# Patient Record
Sex: Male | Born: 1970 | Race: Black or African American | Hispanic: No | Marital: Married | Smoking: Current every day smoker
Health system: Southern US, Community
[De-identification: ages and names within clinical notes are randomized; demographics above are authoritative.]

## PROBLEM LIST (undated history)

## (undated) DIAGNOSIS — I10 Essential (primary) hypertension: Secondary | ICD-10-CM

## (undated) HISTORY — PX: KNEE CARTILAGE SURGERY: SHX688

---

## 2009-10-01 ENCOUNTER — Emergency Department (HOSPITAL_COMMUNITY): Admission: EM | Admit: 2009-10-01 | Discharge: 2009-10-01 | Payer: Self-pay | Admitting: Emergency Medicine

## 2010-04-29 ENCOUNTER — Encounter: Payer: Self-pay | Admitting: Emergency Medicine

## 2018-09-18 ENCOUNTER — Encounter (HOSPITAL_COMMUNITY): Payer: Self-pay | Admitting: Emergency Medicine

## 2018-09-18 ENCOUNTER — Other Ambulatory Visit: Payer: Self-pay

## 2018-09-18 ENCOUNTER — Ambulatory Visit (HOSPITAL_COMMUNITY)
Admission: EM | Admit: 2018-09-18 | Discharge: 2018-09-18 | Disposition: A | Discharge status: Home / Self Care | Payer: 59 | Attending: Internal Medicine | Admitting: Internal Medicine

## 2018-09-18 DIAGNOSIS — I10 Essential (primary) hypertension: Secondary | ICD-10-CM | POA: Diagnosis not present

## 2018-09-18 DIAGNOSIS — Z9114 Patient's other noncompliance with medication regimen: Secondary | ICD-10-CM | POA: Diagnosis not present

## 2018-09-18 DIAGNOSIS — M25552 Pain in left hip: Secondary | ICD-10-CM | POA: Diagnosis not present

## 2018-09-18 HISTORY — DX: Essential (primary) hypertension: I10

## 2018-09-18 MED ORDER — NAPROXEN 375 MG PO TABS: 375.0000 mg | ORAL_TABLET | Freq: Two times a day (BID) | ORAL | 0 refills | Status: DC

## 2018-09-18 MED ORDER — HYDROCHLOROTHIAZIDE 25 MG PO TABS: 25.0000 mg | ORAL_TABLET | Freq: Every day | ORAL | 2 refills | Status: DC

## 2018-09-18 MED ORDER — KETOROLAC TROMETHAMINE 30 MG/ML IJ SOLN
INTRAMUSCULAR | Status: AC
  Filled 2018-09-18: qty 1

## 2018-09-18 MED ORDER — KETOROLAC TROMETHAMINE 30 MG/ML IJ SOLN
30.0000 mg | Freq: Once | INTRAMUSCULAR | Status: AC
  Administered 2018-09-18: 30 mg via INTRAMUSCULAR

## 2018-09-18 NOTE — ED Triage Notes (Signed)
left hip pain started Tuesday and has worsened since then.  Right hip hurt last week.  Patient helped family members move, no known injury.    Patient recently changed work duties as well

## 2018-09-18 NOTE — ED Provider Notes (Signed)
[facility]    CSN: [id number] Arrival date & time: 09/18/18  0857     History   Chief Complaint Chief Complaint  Patient presents with  . Hip Pain    HPI Clarence Pierce is a 48 y.o. male with history of hypertension comes to urgent care with complaints of left hip pain which started a few days ago.  Patient recently helped his daughters to move from one apartment to another.  Patient denies any trauma to the hip.  He has been doing some heavy lifting recently.  Pain is constant and of moderate severity.  Is worse with bearing weight.  Denies any relieving factors.  Patient has not tried any over-the-counter medications.  No fever or chills.  No nausea or vomiting.  No diarrhea.  HPI  Past Medical History:  Diagnosis Date  . Hypertension     There are no active problems to display for this patient.   Past Surgical History:  Procedure Laterality Date  . KNEE CARTILAGE SURGERY         Home Medications    Prior to Admission medications   Medication Sig Start Date End Date Taking? Authorizing Provider  hydrochlorothiazide (HYDRODIURIL) 25 MG tablet Take 1 tablet (25 mg total) by mouth daily. 09/18/18   Merrilee JanskyLamptey, Kacey Dysert O, MD  naproxen (NAPROSYN) 375 MG tablet Take 1 tablet (375 mg total) by mouth 2 (two) times daily. 09/18/18   Clemente Dewey, Britta MccreedyPhilip O, MD    Family History Family History  Problem Relation Age of Onset  . Cancer Mother   . Hypertension Father     Social History Social History   Tobacco Use  . Smoking status: Current Every Day Smoker  Substance Use Topics  . Alcohol use: Yes  . Drug use: Never     Allergies   Patient has no known allergies.   Review of Systems Review of Systems  Constitutional: Negative for activity change, appetite change and fatigue.  HENT: Negative.   Respiratory: Negative.   Cardiovascular: Negative.   Gastrointestinal: Negative.   Genitourinary: Negative for dysuria, frequency, scrotal swelling, testicular  pain and urgency.  Musculoskeletal: Positive for arthralgias and gait problem. Negative for back pain, joint swelling, neck pain and neck stiffness.  Skin: Negative for rash and wound.  Neurological: Negative for dizziness, weakness and light-headedness.     Physical Exam Triage Vital Signs ED Triage Vitals  Enc Vitals Group     BP 09/18/18 0932 (!) 168/105     Pulse Rate 09/18/18 0932 (!) 113     Resp 09/18/18 0932 20     Temp 09/18/18 0932 98.2 F (36.8 C)     Temp src --      SpO2 09/18/18 0932 96 %     Weight --      Height --      Head Circumference --      Peak Flow --      Pain Score 09/18/18 0927 6     Pain Loc --      Pain Edu? --      Excl. in GC? --    No data found.  Updated Vital Signs BP (!) 168/105 (BP Location: Left Arm)   Pulse (!) 113   Temp 98.2 F (36.8 C)   Resp 20   SpO2 96%   Visual Acuity Right Eye Distance:   Left Eye Distance:   Bilateral Distance:    Right Eye Near:   Left Eye Near:  Bilateral Near:     Physical Exam Constitutional:      Appearance: Normal appearance. He is not ill-appearing or toxic-appearing.  Cardiovascular:     Rate and Rhythm: Normal rate and regular rhythm.     Pulses: Normal pulses.     Heart sounds: Normal heart sounds.  Pulmonary:     Effort: Pulmonary effort is normal. No respiratory distress.     Breath sounds: Normal breath sounds. No wheezing or rales.  Abdominal:     General: Abdomen is flat. Bowel sounds are normal. There is no distension.     Tenderness: There is no abdominal tenderness. There is no guarding.  Musculoskeletal:        General: No swelling, tenderness, deformity or signs of injury.  Skin:    General: Skin is warm.     Capillary Refill: Capillary refill takes less than 2 seconds.     Coloration: Skin is not jaundiced.     Findings: No bruising or lesion.  Neurological:     Mental Status: He is alert.      UC Treatments / Results  Labs (all labs ordered are listed, but  only abnormal results are displayed) Labs Reviewed - No data to display  EKG None  Radiology No results found.  Procedures Procedures (including critical care time)  Medications Ordered in UC Medications  ketorolac (TORADOL) 30 MG/ML injection 30 mg (30 mg Intramuscular Given 09/18/18 1003)  ketorolac (TORADOL) 30 MG/ML injection (has no administration in time range)    Initial Impression / Assessment and Plan / UC Course  I have reviewed the triage vital signs and the nursing notes.  Pertinent labs & imaging results that were available during my care of the patient were reviewed by me and considered in my medical decision making (see chart for details).     1.  Left hip pain likely osteoarthritis related: Naproxen as needed for pain Gentle range of motion If pain does not improve, patient may need radiologic evaluation.  2.  Uncontrolled hypertension secondary to medication noncompliance: Patient is started on hydrochlorothiazide 25 mg orally daily Patient is scheduled to follow-up with his primary care physicians in the [place] system   Final Clinical Impressions(s) / UC Diagnoses   Final diagnoses:  Left hip pain   Discharge Instructions   None    ED Prescriptions    Medication Sig Dispense Auth. Provider   naproxen (NAPROSYN) 375 MG tablet Take 1 tablet (375 mg total) by mouth 2 (two) times daily. 30 tablet Mackinley Cassaday, Myrene Galas, MD   hydrochlorothiazide (HYDRODIURIL) 25 MG tablet Take 1 tablet (25 mg total) by mouth daily. 30 tablet Yailine Ballard, Myrene Galas, MD     Controlled Substance Prescriptions [place] Controlled Substance Registry consulted? No   Chase Picket, MD 09/18/18 [phone]

## 2018-12-18 ENCOUNTER — Other Ambulatory Visit: Payer: Self-pay

## 2018-12-18 DIAGNOSIS — Z20822 Contact with and (suspected) exposure to covid-19: Secondary | ICD-10-CM

## 2018-12-19 LAB — NOVEL CORONAVIRUS, NAA: SARS-CoV-2, NAA: NOT DETECTED

## 2019-04-06 ENCOUNTER — Emergency Department (HOSPITAL_COMMUNITY): Payer: 59

## 2019-04-06 ENCOUNTER — Other Ambulatory Visit: Payer: Self-pay

## 2019-04-06 ENCOUNTER — Emergency Department (HOSPITAL_COMMUNITY)
Admission: EM | Admit: 2019-04-06 | Discharge: 2019-04-06 | Disposition: A | Discharge status: Home / Self Care | Payer: 59 | Attending: Emergency Medicine | Admitting: Emergency Medicine

## 2019-04-06 ENCOUNTER — Encounter (HOSPITAL_COMMUNITY): Payer: Self-pay | Admitting: Emergency Medicine

## 2019-04-06 DIAGNOSIS — R7989 Other specified abnormal findings of blood chemistry: Secondary | ICD-10-CM | POA: Diagnosis not present

## 2019-04-06 DIAGNOSIS — Z791 Long term (current) use of non-steroidal anti-inflammatories (NSAID): Secondary | ICD-10-CM | POA: Diagnosis not present

## 2019-04-06 DIAGNOSIS — I4891 Unspecified atrial fibrillation: Secondary | ICD-10-CM | POA: Diagnosis not present

## 2019-04-06 DIAGNOSIS — Z79899 Other long term (current) drug therapy: Secondary | ICD-10-CM | POA: Diagnosis not present

## 2019-04-06 DIAGNOSIS — R0789 Other chest pain: Secondary | ICD-10-CM | POA: Diagnosis present

## 2019-04-06 DIAGNOSIS — I10 Essential (primary) hypertension: Secondary | ICD-10-CM | POA: Insufficient documentation

## 2019-04-06 DIAGNOSIS — R079 Chest pain, unspecified: Secondary | ICD-10-CM

## 2019-04-06 LAB — CBC
HCT: 42.5 % (ref 39.0–52.0)
Hemoglobin: 14.3 g/dL (ref 13.0–17.0)
MCH: 30.9 pg (ref 26.0–34.0)
MCHC: 33.6 g/dL (ref 30.0–36.0)
MCV: 91.8 fL (ref 80.0–100.0)
Platelets: 232 10*3/uL (ref 150–400)
RBC: 4.63 MIL/uL (ref 4.22–5.81)
RDW: 14.3 % (ref 11.5–15.5)
WBC: 9.6 10*3/uL (ref 4.0–10.5)
nRBC: 0 % (ref 0.0–0.2)

## 2019-04-06 LAB — BASIC METABOLIC PANEL
Anion gap: 15 (ref 5–15)
BUN: 15 mg/dL (ref 6–20)
CO2: 22 mmol/L (ref 22–32)
Calcium: 9.8 mg/dL (ref 8.9–10.3)
Chloride: 104 mmol/L (ref 98–111)
Creatinine, Ser: 1.27 mg/dL — ABNORMAL HIGH (ref 0.61–1.24)
GFR calc Af Amer: >60 mL/min (ref >60)
GFR calc non Af Amer: >60 mL/min (ref >60)
Glucose, Bld: 180 mg/dL — ABNORMAL HIGH (ref 70–99)
Potassium: 3.6 mmol/L (ref 3.5–5.1)
Sodium: 141 mmol/L (ref 135–145)

## 2019-04-06 LAB — T4, FREE: Free T4: 0.79 ng/dL (ref 0.61–1.12)

## 2019-04-06 LAB — MAGNESIUM: Magnesium: 1.8 mg/dL (ref 1.7–2.4)

## 2019-04-06 LAB — TROPONIN I (HIGH SENSITIVITY): Troponin I (High Sensitivity): 16 ng/L (ref <18)

## 2019-04-06 LAB — TSH: TSH: 5.465 u[IU]/mL — ABNORMAL HIGH (ref 0.350–4.500)

## 2019-04-06 MED ORDER — SODIUM CHLORIDE 0.9% FLUSH
3.0000 mL | Freq: Once | INTRAVENOUS | Status: AC
  Administered 2019-04-06: 3 mL via INTRAVENOUS

## 2019-04-06 MED ORDER — PROPOFOL 10 MG/ML IV BOLUS
INTRAVENOUS | Status: AC | PRN
  Administered 2019-04-06: 25 mg via INTRAVENOUS

## 2019-04-06 MED ORDER — DILTIAZEM HCL-DEXTROSE 125-5 MG/125ML-% IV SOLN (PREMIX)
5.0000 mg/h | INTRAVENOUS | Status: DC
  Administered 2019-04-06: 5 mg/h via INTRAVENOUS

## 2019-04-06 MED ORDER — PROPOFOL 10 MG/ML IV BOLUS
0.5000 mg/kg | Freq: Once | INTRAVENOUS | Status: AC
  Administered 2019-04-06: 47.9 mg via INTRAVENOUS
  Filled 2019-04-06: qty 20

## 2019-04-06 MED ORDER — DILTIAZEM HCL-DEXTROSE 125-5 MG/125ML-% IV SOLN (PREMIX)
INTRAVENOUS | Status: AC
  Filled 2019-04-06: qty 125

## 2019-04-06 MED ORDER — DILTIAZEM LOAD VIA INFUSION
15.0000 mg | Freq: Once | INTRAVENOUS | Status: AC
  Administered 2019-04-06: 15 mg via INTRAVENOUS
  Filled 2019-04-06: qty 15

## 2019-04-06 MED ORDER — APIXABAN 5 MG PO TABS
5.0000 mg | ORAL_TABLET | ORAL | Status: AC
  Administered 2019-04-06: 5 mg via ORAL
  Filled 2019-04-06: qty 1

## 2019-04-06 MED ORDER — APIXABAN 5 MG PO TABS: 5.0000 mg | ORAL_TABLET | Freq: Two times a day (BID) | ORAL | 0 refills | Status: DC

## 2019-04-06 NOTE — ED Triage Notes (Addendum)
Pt complaint of central chest pain onset 0600 this morning; "on and off;" verbalizing associated "sweating;" denies other symptoms or hx of same.  Pt verbalizes took 2 aspirin at 0600.  Hx of hypertension; has not taken medication in 3 months per pt.

## 2019-04-06 NOTE — Discharge Instructions (Addendum)
You were seen in the emergency department for chest pain and were noted to have a rapid heart rate.  Your heart rhythm was atrial fibrillation.  You were successfully cardioverted back into a normal rhythm.  Will be important that you take your blood thinner daily.  Cardiology clinic should call you for follow-up.  We did blood work and it showed that your thyroid test was slightly abnormal.  This will also need to be followed up with your doctor.  Please return to the emergency department if any worsening symptoms.

## 2019-04-06 NOTE — ED Notes (Signed)
Wasted propofol with Rolla Plate, RN in the biohazard sink.

## 2019-04-06 NOTE — Sedation Documentation (Signed)
Cardioversion administered at 125 J. Synced and shocked. Recorded.

## 2019-04-06 NOTE — ED Provider Notes (Signed)
[facility]EMERGENCY DEPT Provider Note   CSN: [id number] Arrival date & time: 04/06/19  1941     History Chief Complaint  Patient presents with  . Chest Pain    Clarence Pierce is a 48 y.o. male.  He has a history of hypertension.  He said he woke up around 6 this morning with chest pain and sweating.  His symptoms are on and off for about an hour then became more consistent.  He feels his heart beating hard in his chest.  No recent illness.  No shortness of breath.  No prior history of arrhythmias.  Denies any cocaine.  States he drinks alcohol but has not been drinking more last than usual.  The history is provided by the patient.  Chest Pain Pain location:  Substernal area Pain quality: aching   Pain radiates to:  Does not radiate Pain severity:  Moderate Onset quality:  Sudden Duration:  2 hours Timing:  Intermittent Progression:  Unchanged Chronicity:  New Context: at rest   Relieved by:  Nothing Worsened by:  Nothing Ineffective treatments:  None tried Associated symptoms: diaphoresis and palpitations   Associated symptoms: no abdominal pain, no back pain, no cough, no fever, no headache, no nausea, no shortness of breath, no vomiting and no weakness   Risk factors: hypertension, male sex and smoking        Past Medical History:  Diagnosis Date  . Hypertension     There are no problems to display for this patient.   Past Surgical History:  Procedure Laterality Date  . KNEE CARTILAGE SURGERY         Family History  Problem Relation Age of Onset  . Cancer Mother   . Hypertension Father     Social History   Tobacco Use  . Smoking status: Current Every Day Smoker  Substance Use Topics  . Alcohol use: Yes  . Drug use: Never    Home Medications Prior to Admission medications   Medication Sig Start Date End Date Taking? Authorizing Provider  hydrochlorothiazide (HYDRODIURIL) 25 MG tablet Take 1 tablet (25 mg total) by mouth  daily. 09/18/18   Merrilee Jansky, MD  naproxen (NAPROSYN) 375 MG tablet Take 1 tablet (375 mg total) by mouth 2 (two) times daily. 09/18/18   Lamptey, Britta Mccreedy, MD    Allergies    Patient has no known allergies.  Review of Systems   Review of Systems  Constitutional: Positive for diaphoresis. Negative for fever.  HENT: Negative for sore throat.   Eyes: Negative for visual disturbance.  Respiratory: Negative for cough and shortness of breath.   Cardiovascular: Positive for chest pain and palpitations.  Gastrointestinal: Negative for abdominal pain, nausea and vomiting.  Genitourinary: Negative for dysuria.  Musculoskeletal: Negative for back pain.  Skin: Negative for rash.  Neurological: Negative for weakness and headaches.    Physical Exam Updated Vital Signs BP (!) 127/95 (BP Location: Left Arm)   Pulse (!) 153   Temp 97.6 F (36.4 C) (Oral)   Resp 16   Ht 5\' 9"  (1.753 m)   Wt 95.7 kg   SpO2 96%   BMI 31.16 kg/m   Physical Exam Vitals and nursing note reviewed.  Constitutional:      Appearance: He is well-developed.  HENT:     Head: Normocephalic and atraumatic.  Eyes:     Conjunctiva/sclera: Conjunctivae normal.  Cardiovascular:     Rate and Rhythm: Tachycardia present. Rhythm irregular.  Pulses:          Radial pulses are 2+ on the right side and 2+ on the left side.     Heart sounds: No murmur.  Pulmonary:     Effort: Pulmonary effort is normal. No respiratory distress.     Breath sounds: Normal breath sounds.  Abdominal:     Palpations: Abdomen is soft.     Tenderness: There is no abdominal tenderness.  Musculoskeletal:        General: Normal range of motion.     Cervical back: Neck supple.     Right lower leg: No tenderness. No edema.     Left lower leg: No tenderness. No edema.  Skin:    General: Skin is warm and dry.     Capillary Refill: Capillary refill takes less than 2 seconds.  Neurological:     General: No focal deficit present.      Mental Status: He is alert.     ED Results / Procedures / Treatments   Labs (all labs ordered are listed, but only abnormal results are displayed) Labs Reviewed  BASIC METABOLIC PANEL - Abnormal; Notable for the following components:      Result Value   Glucose, Bld 180 (*)    Creatinine, Ser 1.27 (*)    All other components within normal limits  TSH - Abnormal; Notable for the following components:   TSH 5.465 (*)    All other components within normal limits  CBC  MAGNESIUM  T4, FREE  T3  TROPONIN I (HIGH SENSITIVITY)    EKG EKG Interpretation  Date/Time:  Monday April 06 2019 07:50:33 EST Ventricular Rate:  174 PR Interval:    QRS Duration: 77 QT Interval:  297 QTC Calculation: 506 R Axis:   -17 Text Interpretation: Atrial fibrillation with rapid V-rate Ventricular premature complex Borderline left axis deviation Abnormal R-wave progression, early transition No old tracing to compare Confirmed by Meridee Score [phone]) on 04/06/2019 8:00:34 AM   Radiology DG Chest Port 1 View  Result Date: 04/06/2019 CLINICAL DATA:  48 year old male with a history chest pain EXAM: PORTABLE CHEST 1 VIEW COMPARISON:  None. FINDINGS: The heart size and mediastinal contours are within normal limits. Both lungs are clear. The visualized skeletal structures are unremarkable. IMPRESSION: Negative for acute cardiopulmonary disease Electronically Signed   By: Gilmer Mor D.O.   On: 04/06/2019 08:25    Procedures .Critical Care Performed by: Terrilee Files, MD Authorized by: Terrilee Files, MD   Critical care provider statement:    Critical care time (minutes):  45   Critical care time was exclusive of:  Separately billable procedures and treating other patients   Critical care was necessary to treat or prevent imminent or life-threatening deterioration of the following conditions:  Circulatory failure and cardiac failure   Critical care was time spent personally by me on the  following activities:  Discussions with consultants, evaluation of patient's response to treatment, examination of patient, ordering and performing treatments and interventions, ordering and review of laboratory studies, ordering and review of radiographic studies, pulse oximetry, re-evaluation of patient's condition, obtaining history from patient or surrogate, review of old charts and development of treatment plan with patient or surrogate   I assumed direction of critical care for this patient from another provider in my specialty: no   .Sedation  Date/Time: 04/06/2019 9:46 AM Performed by: Terrilee Files, MD Authorized by: Terrilee Files, MD   Consent:    Consent obtained:  Verbal and written   Consent given by:  Patient   Risks discussed:  Allergic reaction, dysrhythmia, inadequate sedation, nausea, prolonged hypoxia resulting in organ damage, prolonged sedation necessitating reversal, respiratory compromise necessitating ventilatory assistance and intubation and vomiting   Alternatives discussed:  Analgesia without sedation, anxiolysis and regional anesthesia Universal protocol:    Procedure explained and questions answered to patient or proxy's satisfaction: yes     Relevant documents present and verified: yes     Test results available and properly labeled: yes     Imaging studies available: yes     Required blood products, implants, devices, and special equipment available: yes     Site/side marked: yes     Immediately prior to procedure a time out was called: yes     Patient identity confirmation method:  Verbally with patient and arm band Indications:    Procedure necessitating sedation performed by:  Physician performing sedation Pre-sedation assessment:    Time since last food or drink:  12   ASA classification: class 2 - patient with mild systemic disease     Neck mobility: normal     Mouth opening:  3 or more finger widths   Thyromental distance:  4 finger widths    Mallampati score:  I - soft palate, uvula, fauces, pillars visible   Pre-sedation assessments completed and reviewed: airway patency, cardiovascular function, hydration status, mental status, nausea/vomiting, pain level, respiratory function and temperature     Pre-sedation assessment completed:  04/06/2019 9:00 AM Immediate pre-procedure details:    Reassessment: Patient reassessed immediately prior to procedure     Reviewed: vital signs, relevant labs/tests and NPO status     Verified: bag valve mask available, emergency equipment available, intubation equipment available, IV patency confirmed, oxygen available and suction available   Procedure details (see MAR for exact dosages):    Preoxygenation:  Nasal cannula   Sedation:  Propofol   Intended level of sedation: deep   Intra-procedure monitoring:  Blood pressure monitoring, cardiac monitor, continuous pulse oximetry, frequent LOC assessments, frequent vital sign checks and continuous capnometry   Intra-procedure events: none     Total Provider sedation time (minutes):  15 Post-procedure details:    Post-sedation assessment completed:  04/06/2019 10:01 AM   Attendance: Constant attendance by certified staff until patient recovered     Recovery: Patient returned to pre-procedure baseline     Post-sedation assessments completed and reviewed: airway patency, cardiovascular function, hydration status, mental status, nausea/vomiting, pain level, respiratory function and temperature     Patient is stable for discharge or admission: yes     Patient tolerance:  Tolerated well, no immediate complications .Cardioversion  Date/Time: 04/06/2019 9:47 AM Performed by: Hayden Rasmussen, MD Authorized by: Hayden Rasmussen, MD   Consent:    Consent obtained:  Written   Consent given by:  Patient   Risks discussed:  Cutaneous burn, death, induced arrhythmia and pain   Alternatives discussed:  Rate-control medication, delayed treatment and  observation Pre-procedure details:    Cardioversion basis:  Elective   Rhythm:  Atrial fibrillation   Electrode placement:  Anterior-posterior Patient sedated: Yes. Refer to sedation procedure documentation for details of sedation.  Attempt one:    Cardioversion mode:  Synchronous   Shock (Joules):  120   Shock outcome:  Conversion to normal sinus rhythm Post-procedure details:    Patient status:  Awake   Patient tolerance of procedure:  Tolerated well, no immediate complications   (including critical care time)  Medications Ordered in ED Medications  diltiazem (CARDIZEM) 1 mg/mL load via infusion 15 mg (15 mg Intravenous Bolus from Bag 04/06/19 0804)    And  diltiazem (CARDIZEM) 125 mg in dextrose 5% 125 mL (1 mg/mL) infusion (12.5 mg/hr Intravenous Rate/Dose Change 04/06/19 0900)  dilTIAZem HCl-Dextrose 125-5 MG/125ML-% infusion (  Not Given 04/06/19 0815)  sodium chloride flush (NS) 0.9 % injection 3 mL (3 mLs Intravenous Given 04/06/19 0802)  propofol (DIPRIVAN) 10 mg/mL bolus/IV push 47.9 mg (47.9 mg Intravenous Given 04/06/19 0938)  propofol (DIPRIVAN) 10 mg/mL bolus/IV push (25 mg Intravenous Given 04/06/19 0940)    ED Course  I have reviewed the triage vital signs and the nursing notes.  Pertinent labs & imaging results that were available during my care of the patient were reviewed by me and considered in my medical decision making (see chart for details).  Clinical Course as of Apr 06 1051  Mon Apr 06, 2019  [id number] 48 year old male history of hypertension here with chest pain and diaphoresis acute onset 6 AM.  He is in a narrow complex irregular fast rhythm likely A. fib.  He had taken aspirin prior to arrival.  His blood pressure is okay so we will proceed with rate control for now.   [MB]  [id number] Chest x-ray interpreted by me as no pneumothorax no gross infiltrates.   [MB]  [id number] Patient received 15 mg of IV diltiazem followed by a titratable drip.  Heart rates improving  down to 120s with some brief bursts up to 140.  His symptoms are improved.   [MB]  0920 Patient remains on a diltiazem drip with elevated heart rates of 1 20-1 40.  Not symptomatic.  Reviewed with cardiology Dr Elease HashimotoNahser.  He felt that if we are confident of the timeframe of onset of A. fib then can proceed with cardioversion.  Reviewed with the patient the risks and benefits of both procedural sedation and cardioversion.  He is agreeable to procedure.  He had a sip of water this morning but is otherwise n.p.o.   [MB]  [id number] Patient successfully cardioverted with 120 J synchronized.  Repeat EKG shows sinus tachycardia.   [MB]  1021 Pharmacy has counseled the patient regarding anticoagulation.   [MB]    Clinical Course User Index [MB] Terrilee FilesButler, Sira Adsit C, MD   MDM Rules/Calculators/A&P                     This patients CHA2DS2-VASc Score and unadjusted Ischemic Stroke Rate (% per year) is equal to 0.6 % stroke rate/year from a score of 1  Above score calculated as 1 point each if present [CHF, HTN, DM, Vascular=MI/PAD/Aortic Plaque, Age if 65-74, or Male] Above score calculated as 2 points each if present [Age > 75, or Stroke/TIA/TE]  CHA2DS2/VAS Stroke Risk Points  Current as of 2 minutes ago     1 >= 2 Points: High Risk  1 - 1.99 Points: Medium Risk  0 Points: Low Risk    The patient's score has not changed in the past year.: No Change     Details    This score determines the patient's risk of having a stroke if the  patient has atrial fibrillation.       Points Metrics  0 Has Congestive Heart Failure:  No    Current as of 2 minutes ago  0 Has Vascular Disease:  No    Current as of 2 minutes ago  1 Has Hypertension:  Yes  Current as of 2 minutes ago  0 Age:  12    Current as of 2 minutes ago  0 Has Diabetes:  No    Current as of 2 minutes ago  0 Had Stroke:  No  Had TIA:  No  Had thromboembolism:  No    Current as of 2 minutes ago  0 Male:  No    Current as of 2 minutes  ago                  Final Clinical Impression(s) / ED Diagnoses Final diagnoses:  Atrial fibrillation with RVR (HCC)  Nonspecific chest pain  High serum thyroid stimulating hormone (TSH)  Essential hypertension    Rx / DC Orders ED Discharge Orders         Ordered    Amb referral to [facility]     04/06/19 0800    apixaban (ELIQUIS) 5 MG TABS tablet  2 times daily     04/06/19 1019           Terrilee Files, MD 04/06/19 1703

## 2019-04-06 NOTE — Sedation Documentation (Signed)
EKG given to Melina Copa, MD.

## 2019-04-06 NOTE — Progress Notes (Signed)
[place] for apixaban Indication: atrial fibrillation, now s/p cardioversion  No Known Allergies  Patient Measurements: Height: 5\' 9"  (175.3 cm) Weight: 211 lb (95.7 kg) IBW/kg (Calculated) : 70.7   Vital Signs: Temp: 97.6 F (36.4 C) (12/28 0752) Temp Source: Oral (12/28 0752) BP: 161/116 (12/28 1015) Pulse Rate: 101 (12/28 1010)  Labs: Recent Labs    04/06/19 0751  HGB 14.3  HCT 42.5  PLT 232  CREATININE 1.27*  TROPONINIHS 16    Estimated Creatinine Clearance: 79.4 mL/min (A) (by C-G formula based on SCr of 1.27 mg/dL (H)).   PTA Medications:  HCTZ 25 mg daily Naproxen 375 mg BID  Assessment: 48 y/o M with h/o HTN, had onset of chest pain and diaphoresis 6 am today and was found to be in rapid narrow complex irregular rhythm, likely atrial fibrillation.  Underwent successful cardioversion this AM.  Orders received for pharmacy assistance with direct-acting oral anticoagulant (DOAC) dosing.  Age, Wt, and SCr are all below weight-adjustment thresholds and support proceeding with full-dose anticoagulation with apixaban    Plan:  Apixaban 5 mg PO now, then BID Provided medication teaching; questions regarding apixaban were answered Provided a voucher card for patient to have a 30-day prescription filled at no cost to him Patient will need a prescription for apixaban 5 mg PO BID.  Thank you for allowing pharmacy to participate in this patient's care.  Clayburn Pert, PharmD, BCPS 04/06/2019  10:32 AM

## 2019-04-07 LAB — T3: T3, Total: 120 ng/dL (ref 71–180)

## 2019-04-17 ENCOUNTER — Telehealth (HOSPITAL_COMMUNITY): Payer: Self-pay | Admitting: Physician Assistant

## 2019-04-17 NOTE — Telephone Encounter (Signed)
Referral received from [facility] for pt to be seen in [facility].  Called and left message for pt to call back to schedule.  Caleb Popp, RMA, had previously left message on 04/09/2019 requesting pt to call us to schedule.

## 2019-04-24 ENCOUNTER — Encounter (HOSPITAL_COMMUNITY): Payer: Self-pay | Admitting: Physician Assistant

## 2019-04-27 NOTE — Telephone Encounter (Signed)
Letter mailed to pt to call [facility] to scheduled ED f/u appt.

## 2019-05-07 ENCOUNTER — Encounter (HOSPITAL_COMMUNITY): Payer: Self-pay | Admitting: Physician Assistant

## 2019-05-07 ENCOUNTER — Ambulatory Visit (HOSPITAL_COMMUNITY)
Admission: RE | Admit: 2019-05-07 | Discharge: 2019-05-07 | Disposition: A | Discharge status: Home / Self Care | Payer: 59 | Source: Ambulatory Visit | Attending: Physician Assistant | Admitting: Physician Assistant

## 2019-05-07 ENCOUNTER — Other Ambulatory Visit: Payer: Self-pay

## 2019-05-07 VITALS — BP 146/94 | HR 98 | Ht 69.0 in | Wt 209.8 lb

## 2019-05-07 DIAGNOSIS — I10 Essential (primary) hypertension: Secondary | ICD-10-CM | POA: Insufficient documentation

## 2019-05-07 DIAGNOSIS — I480 Paroxysmal atrial fibrillation: Secondary | ICD-10-CM | POA: Diagnosis not present

## 2019-05-07 DIAGNOSIS — F17200 Nicotine dependence, unspecified, uncomplicated: Secondary | ICD-10-CM | POA: Insufficient documentation

## 2019-05-07 DIAGNOSIS — E669 Obesity, unspecified: Secondary | ICD-10-CM | POA: Diagnosis not present

## 2019-05-07 DIAGNOSIS — I4891 Unspecified atrial fibrillation: Secondary | ICD-10-CM | POA: Diagnosis present

## 2019-05-07 DIAGNOSIS — Z8249 Family history of ischemic heart disease and other diseases of the circulatory system: Secondary | ICD-10-CM | POA: Diagnosis not present

## 2019-05-07 DIAGNOSIS — Z79899 Other long term (current) drug therapy: Secondary | ICD-10-CM | POA: Insufficient documentation

## 2019-05-07 DIAGNOSIS — Z7901 Long term (current) use of anticoagulants: Secondary | ICD-10-CM | POA: Insufficient documentation

## 2019-05-07 MED ORDER — DILTIAZEM HCL ER COATED BEADS 180 MG PO CP24: 180.0000 mg | ORAL_CAPSULE | Freq: Every day | ORAL | 6 refills | Status: DC

## 2019-05-07 MED ORDER — APIXABAN 5 MG PO TABS: 5.0000 mg | ORAL_TABLET | Freq: Two times a day (BID) | ORAL | 3 refills | Status: AC

## 2019-05-07 NOTE — Patient Instructions (Signed)
Stop amlodipine  Start Cardizem 180mg  once a day

## 2019-05-07 NOTE — Progress Notes (Signed)
Primary Care Physician: Patient, No Pcp Per Primary Cardiologist: none Primary Electrophysiologist: none Referring Physician: [facility]   Clarence Pierce is a 49 y.o. male with a history of HTN and new onset atrial fibrillation who presents for consultation in the [facility].  The patient was initially diagnosed with atrial fibrillation on 04/06/19 after presenting to the ER with symptoms of chest pain, palpitations, and sweating. He was started in a diltiazem drip for rate control and underwent DCCV. Patient was started on Eliquis for a CHADS2VASC score of 1. There were no specific triggers that the patient could identify. He does admit to alcohol use, about 3 drinks daily. He also admits to severe snoring and witnessed apneic episodes. Since the DCCV he has not had any further symptoms. He is tolerating the Eliquis without difficulty.   Today, he denies symptoms of palpitations, chest pain, shortness of breath, orthopnea, PND, lower extremity edema, dizziness, presyncope, syncope, bleeding, or neurologic sequela. The patient is tolerating medications without difficulties and is otherwise without complaint today.    Atrial Fibrillation Risk Factors:  he does have symptoms of sleep apnea. he does not have a history of rheumatic fever. he does have a history of alcohol use. The patient does not have a history of early familial atrial fibrillation or other arrhythmias.  he has a BMI of There is no height or weight on file to calculate BMI.. There were no vitals filed for this visit.  Family History  Problem Relation Age of Onset  . Cancer Mother   . Hypertension Father      Atrial Fibrillation Management history:  Previous antiarrhythmic drugs: none Previous cardioversions: 04/06/19 Previous ablations: none   Past Medical History:  Diagnosis Date  . Hypertension    Past Surgical History:  Procedure Laterality Date  . KNEE CARTILAGE  SURGERY      Current Outpatient Medications  Medication Sig Dispense Refill  . apixaban (ELIQUIS) 5 MG TABS tablet Take 1 tablet (5 mg total) by mouth 2 (two) times daily. 60 tablet 0  . hydrochlorothiazide (HYDRODIURIL) 25 MG tablet Take 1 tablet (25 mg total) by mouth daily. (Patient not taking: Reported on 04/06/2019) 30 tablet 2  . naproxen (NAPROSYN) 375 MG tablet Take 1 tablet (375 mg total) by mouth 2 (two) times daily. (Patient not taking: Reported on 04/06/2019) 30 tablet 0   No current facility-administered medications for this visit.    No Known Allergies  Social History   Socioeconomic History  . Marital status: Married    Spouse name: Not on file  . Number of children: Not on file  . Years of education: Not on file  . Highest education level: Not on file  Occupational History  . Not on file  Tobacco Use  . Smoking status: Current Every Day Smoker  Substance and Sexual Activity  . Alcohol use: Yes  . Drug use: Never  . Sexual activity: Not on file  Other Topics Concern  . Not on file  Social History Narrative  . Not on file   Social Determinants of Health   Financial Resource Strain:   . Difficulty of Paying Living Expenses: Not on file  Food Insecurity:   . Worried About Programme researcher, broadcasting/film/video in the Last Year: Not on file  . Ran Out of Food in the Last Year: Not on file  Transportation Needs:   . Lack of Transportation (Medical): Not on file  . Lack of Transportation (  Non-Medical): Not on file  Physical Activity:   . Days of Exercise per Week: Not on file  . Minutes of Exercise per Session: Not on file  Stress:   . Feeling of Stress : Not on file  Social Connections:   . Frequency of Communication with Friends and Family: Not on file  . Frequency of Social Gatherings with Friends and Family: Not on file  . Attends Religious Services: Not on file  . Active Member of Clubs or Organizations: Not on file  . Attends Archivist Meetings: Not on  file  . Marital Status: Not on file  Intimate Partner Violence:   . Fear of Current or Ex-Partner: Not on file  . Emotionally Abused: Not on file  . Physically Abused: Not on file  . Sexually Abused: Not on file     ROS- All systems are reviewed and negative except as per the HPI above.  Physical Exam: There were no vitals filed for this visit.  GEN- The patient is well appearing obese male, alert and oriented x 3 today.   Head- normocephalic, atraumatic Eyes-  Sclera clear, conjunctiva pink Ears- hearing intact Oropharynx- clear Neck- supple  Lungs- Clear to ausculation bilaterally, normal work of breathing Heart- Regular rate and rhythm, no murmurs, rubs or gallops  GI- soft, NT, ND, + BS Extremities- no clubbing, cyanosis, or edema MS- no significant deformity or atrophy Skin- no rash or lesion Psych- euthymic mood, full affect Neuro- strength and sensation are intact  Wt Readings from Last 3 Encounters:  04/06/19 211 lb (95.7 kg)    EKG today demonstrates SR HR 98, PR 160, QRS 80, QTc 439  Epic records are reviewed at length today  CHA2DS2-VASc Score = 1 The patient's score is based upon: CHF History: No HTN History: Yes Age : < 65 Diabetes History: No Stroke History: No Vascular Disease History: No Gender: Male      ASSESSMENT AND PLAN: 1. Paroxysmal Atrial Fibrillation (ICD10:  I48.0) The patient's CHA2DS2-VASc score is 1, indicating a 0.6% annual risk of stroke.   General education about afib provided and questions answered. We also discussed his stroke risk and the risks and benefits of anticoagulation. Patient prefers to continue on anticoagulation for now. Continue Eliquis 5 mg BID. Will change amlodipine to diltiazem 180 mg daily for rate control. Check echocardiogram Lifestyle modification was discussed and encouraged including reducing alcohol intake.  2. Obesity There is no height or weight on file to calculate BMI. Lifestyle modification  was discussed at length including regular exercise and weight reduction.  3. Snoring/witnessed apnea The importance of adequate treatment of sleep apnea was discussed today in order to improve our ability to maintain sinus rhythm long term. Will refer patient for sleep study.  4. HTN Borderline today, med changes as above. May need to titrate diltiazem further at follow up.   Follow up in the AF clinic in one month.    [facility] [address] [place], [place] [address] [phone] 05/07/2019 10:04 AM

## 2019-05-11 ENCOUNTER — Telehealth: Payer: Self-pay | Admitting: *Deleted

## 2019-05-11 NOTE — Telephone Encounter (Signed)
Sleep PA submitted to [employer] via web portal. 

## 2019-05-13 ENCOUNTER — Other Ambulatory Visit: Payer: Self-pay

## 2019-05-13 ENCOUNTER — Ambulatory Visit (HOSPITAL_COMMUNITY)
Admission: RE | Admit: 2019-05-13 | Discharge: 2019-05-13 | Disposition: A | Discharge status: Home / Self Care | Payer: 59 | Source: Ambulatory Visit | Attending: Physician Assistant | Admitting: Physician Assistant

## 2019-05-13 ENCOUNTER — Telehealth: Payer: Self-pay | Admitting: *Deleted

## 2019-05-13 DIAGNOSIS — I480 Paroxysmal atrial fibrillation: Secondary | ICD-10-CM | POA: Diagnosis present

## 2019-05-13 DIAGNOSIS — F17200 Nicotine dependence, unspecified, uncomplicated: Secondary | ICD-10-CM | POA: Insufficient documentation

## 2019-05-13 DIAGNOSIS — R0681 Apnea, not elsewhere classified: Secondary | ICD-10-CM

## 2019-05-13 DIAGNOSIS — I517 Cardiomegaly: Secondary | ICD-10-CM | POA: Insufficient documentation

## 2019-05-13 NOTE — Telephone Encounter (Signed)
-----   Message from Gaynelle Cage, CMA sent at 05/13/2019  9:44 AM EST ----- Regarding: RE: sleep study Received a call from [facility] denying in lab sleep study. Order and schedule HST if ok with provider or they can call [phone] for peer to peer. ----- Message ----- From: Shona Simpson, RN Sent: 05/07/2019  11:38 AM EST To: Cv Div Sleep Studies Subject: sleep study                                    Pt needs sleep study for afib, snoring, witnessed apnea per ricky fenton pa Thanks Stacy RN AFib

## 2019-05-13 NOTE — Telephone Encounter (Signed)
Staff message sent to [facility] received a call from [facility] denying a in lab sleep study. Notify ordering provider can do peer to peer @ [phone] or order HSTwhich does not need a PA.

## 2019-05-13 NOTE — Progress Notes (Signed)
  Echocardiogram 2D Echocardiogram has been performed.  Adine Madura 05/13/2019, 10:39 AM

## 2019-05-18 ENCOUNTER — Encounter (HOSPITAL_COMMUNITY): Payer: Self-pay | Admitting: *Deleted

## 2019-05-27 ENCOUNTER — Telehealth: Payer: Self-pay | Admitting: *Deleted

## 2019-05-27 NOTE — Telephone Encounter (Signed)
Patient is aware of Home Sleep Study through [facility]. Patient is scheduled for 07/23/19 at 10 am to pick up home sleep kit and meet with Respiratory therapist at [facility]. Patient is aware that if this appointment date and time does not work for them they should contact Artis Delay directly at [phone]. Patient is aware that a sleep packet will be sent from [facility] in week. Left detailed message on voicemail with date and time of Home Sleep and informed patient to call back to confirm or reschedule.

## 2019-05-27 NOTE — Telephone Encounter (Signed)
-----   Message from Wanda M Waddell, CMA sent at 05/13/2019  9:44 AM EST ----- Regarding: RE: sleep study Received a call from [employer] denying in lab sleep study. Order and schedule HST if ok with provider or they can call [phone] for peer to peer. ----- Message ----- From: Carter, Stacy S, RN Sent: 05/07/2019  11:38 AM EST To: Cv Div Sleep Studies Subject: sleep study                                    Pt needs sleep study for afib, snoring, witnessed apnea per ricky fenton pa Thanks Stacy RN AFib    

## 2019-06-08 ENCOUNTER — Other Ambulatory Visit: Payer: Self-pay

## 2019-06-08 ENCOUNTER — Encounter (HOSPITAL_COMMUNITY): Payer: Self-pay

## 2019-06-08 ENCOUNTER — Ambulatory Visit (HOSPITAL_COMMUNITY)
Admission: RE | Admit: 2019-06-08 | Discharge: 2019-06-08 | Disposition: A | Discharge status: Home / Self Care | Payer: 59 | Source: Ambulatory Visit | Attending: Physician Assistant | Admitting: Physician Assistant

## 2019-06-08 VITALS — BP 160/110 | HR 93 | Ht 69.0 in | Wt 205.6 lb

## 2019-06-08 DIAGNOSIS — Z6830 Body mass index (BMI) 30.0-30.9, adult: Secondary | ICD-10-CM | POA: Insufficient documentation

## 2019-06-08 DIAGNOSIS — I480 Paroxysmal atrial fibrillation: Secondary | ICD-10-CM | POA: Insufficient documentation

## 2019-06-08 DIAGNOSIS — R0683 Snoring: Secondary | ICD-10-CM | POA: Insufficient documentation

## 2019-06-08 DIAGNOSIS — E669 Obesity, unspecified: Secondary | ICD-10-CM | POA: Diagnosis not present

## 2019-06-08 DIAGNOSIS — I4891 Unspecified atrial fibrillation: Secondary | ICD-10-CM | POA: Diagnosis present

## 2019-06-08 DIAGNOSIS — Z79899 Other long term (current) drug therapy: Secondary | ICD-10-CM | POA: Diagnosis not present

## 2019-06-08 DIAGNOSIS — Z7901 Long term (current) use of anticoagulants: Secondary | ICD-10-CM | POA: Diagnosis not present

## 2019-06-08 DIAGNOSIS — I10 Essential (primary) hypertension: Secondary | ICD-10-CM | POA: Insufficient documentation

## 2019-06-08 DIAGNOSIS — F17210 Nicotine dependence, cigarettes, uncomplicated: Secondary | ICD-10-CM | POA: Diagnosis not present

## 2019-06-08 DIAGNOSIS — R0681 Apnea, not elsewhere classified: Secondary | ICD-10-CM | POA: Insufficient documentation

## 2019-06-08 DIAGNOSIS — Z8249 Family history of ischemic heart disease and other diseases of the circulatory system: Secondary | ICD-10-CM | POA: Insufficient documentation

## 2019-06-08 LAB — CBC
HCT: 42.1 % (ref 39.0–52.0)
Hemoglobin: 13.7 g/dL (ref 13.0–17.0)
MCH: 30 pg (ref 26.0–34.0)
MCHC: 32.5 g/dL (ref 30.0–36.0)
MCV: 92.3 fL (ref 80.0–100.0)
Platelets: 200 10*3/uL (ref 150–400)
RBC: 4.56 MIL/uL (ref 4.22–5.81)
RDW: 13.8 % (ref 11.5–15.5)
WBC: 7.4 10*3/uL (ref 4.0–10.5)
nRBC: 0 % (ref 0.0–0.2)

## 2019-06-08 MED ORDER — DILTIAZEM HCL ER COATED BEADS 240 MG PO CP24: 240.0000 mg | ORAL_CAPSULE | Freq: Every day | ORAL | 6 refills | Status: AC

## 2019-06-08 NOTE — Progress Notes (Signed)
Primary Care Physician: [facility] Primary Cardiologist: none Primary Electrophysiologist: none Referring Physician: [facility]   Clarence Pierce is a 49 y.o. male with a history of HTN and new onset atrial fibrillation who presents for follow up in the [facility].  The patient was initially diagnosed with atrial fibrillation on 04/06/19 after presenting to the ER with symptoms of chest pain, palpitations, and sweating. He was started in a diltiazem drip for rate control and underwent DCCV. Patient was started on Eliquis for a CHADS2VASC score of 1. There were no specific triggers that the patient could identify. He does admit to alcohol use, about 3 drinks daily. He also admits to severe snoring and witnessed apneic episodes.   On follow up today, patient reports that he has done well since his last appointment. He has not had any heart racing or palpitations. Echo showed normal EF and normal LA size. He has a sleep study pending. He denies any bleeding issues on anticoagulation.  Today, he denies symptoms of palpitations, chest pain, shortness of breath, orthopnea, PND, lower extremity edema, dizziness, presyncope, syncope, bleeding, or neurologic sequela. The patient is tolerating medications without difficulties and is otherwise without complaint today.    Atrial Fibrillation Risk Factors:  he does have symptoms of sleep apnea. he does not have a history of rheumatic fever. he does have a history of alcohol use. The patient does not have a history of early familial atrial fibrillation or other arrhythmias.  he has a BMI of Body mass index is 30.36 kg/m.Marland Kitchen Filed Weights   06/08/19 1036  Weight: 93.3 kg    Family History  Problem Relation Age of Onset  . Cancer Mother   . Hypertension Father      Atrial Fibrillation Management history:  Previous antiarrhythmic drugs: none Previous cardioversions: 04/06/19 Previous ablations:  none   Past Medical History:  Diagnosis Date  . Hypertension    Past Surgical History:  Procedure Laterality Date  . KNEE CARTILAGE SURGERY      Current Outpatient Medications  Medication Sig Dispense Refill  . apixaban (ELIQUIS) 5 MG TABS tablet Take 1 tablet (5 mg total) by mouth 2 (two) times daily. 60 tablet 3  . diltiazem (CARDIZEM CD) 240 MG 24 hr capsule Take 1 capsule (240 mg total) by mouth daily. 30 capsule 6  . losartan (COZAAR) 50 MG tablet Taking 1/2 tablet daily     No current facility-administered medications for this encounter.    No Known Allergies  Social History   Socioeconomic History  . Marital status: Married    Spouse name: Not on file  . Number of children: Not on file  . Years of education: Not on file  . Highest education level: Not on file  Occupational History  . Not on file  Tobacco Use  . Smoking status: Current Every Day Smoker    Packs/day: 0.50    Years: 30.00    Pack years: 15.00    Types: Cigarettes  . Smokeless tobacco: Never Used  Substance and Sexual Activity  . Alcohol use: Yes    Comment: 3 drinks at night  . Drug use: Never  . Sexual activity: Not on file  Other Topics Concern  . Not on file  Social History Narrative  . Not on file   Social Determinants of Health   Financial Resource Strain:   . Difficulty of Paying Living Expenses: Not on file  Food Insecurity:   .  Worried About Programme researcher, broadcasting/film/video in the Last Year: Not on file  . Ran Out of Food in the Last Year: Not on file  Transportation Needs:   . Lack of Transportation (Medical): Not on file  . Lack of Transportation (Non-Medical): Not on file  Physical Activity:   . Days of Exercise per Week: Not on file  . Minutes of Exercise per Session: Not on file  Stress:   . Feeling of Stress : Not on file  Social Connections:   . Frequency of Communication with Friends and Family: Not on file  . Frequency of Social Gatherings with Friends and Family: Not on file   . Attends Religious Services: Not on file  . Active Member of Clubs or Organizations: Not on file  . Attends Banker Meetings: Not on file  . Marital Status: Not on file  Intimate Partner Violence:   . Fear of Current or Ex-Partner: Not on file  . Emotionally Abused: Not on file  . Physically Abused: Not on file  . Sexually Abused: Not on file     ROS- All systems are reviewed and negative except as per the HPI above.  Physical Exam: Vitals:   06/08/19 1036  BP: (!) 160/110  Pulse: 93  Weight: 93.3 kg  Height: 5\' 9"  (1.753 m)    GEN- The patient is well appearing obese male, alert and oriented x 3 today.   HEENT-head normocephalic, atraumatic, sclera clear, conjunctiva pink, hearing intact, trachea midline. Lungs- Clear to ausculation bilaterally, normal work of breathing Heart- Regular rate and rhythm, no murmurs, rubs or gallops  GI- soft, NT, ND, + BS Extremities- no clubbing, cyanosis, or edema MS- no significant deformity or atrophy Skin- no rash or lesion Psych- euthymic mood, full affect Neuro- strength and sensation are intact   Wt Readings from Last 3 Encounters:  06/08/19 93.3 kg  05/07/19 95.2 kg  04/06/19 95.7 kg    EKG today demonstrates SR HR 93, PR 164, QRS 80, QTc 440  Echo 05/13/19 demonstrated 1. Left ventricular ejection fraction, by visual estimation, is 60 to  65%. The left ventricle has normal function. There is mildly increased  left ventricular hypertrophy. Normal diastolic function.  2. Global right ventricle has normal systolic function.The right  ventricular size is normal.  3. Left atrial size was normal.  4. Right atrial size was normal.  5. The mitral valve is normal in structure. No evidence of mitral valve  regurgitation.  6. The tricuspid valve is normal in structure. Tricuspid valve  regurgitation is not demonstrated.  7. The aortic valve is tricuspid. Aortic valve regurgitation is not  visualized. No  evidence of aortic valve sclerosis or stenosis.  8. The pulmonic valve was not well visualized. Pulmonic valve  regurgitation is not visualized.  9. TR signal is inadequate for assessing pulmonary artery systolic  pressure.  10. The inferior vena cava is normal in size with greater than 50%  respiratory variability, suggesting right atrial pressure of 3 mmHg.   Epic records are reviewed at length today  CHA2DS2-VASc Score = 1 The patient's score is based upon: CHF History: No HTN History: Yes Age : < 65 Diabetes History: No Stroke History: No Vascular Disease History: No Gender: Male   ASSESSMENT AND PLAN: 1. Paroxysmal Atrial Fibrillation (ICD10:  I48.0) The patient's CHA2DS2-VASc score is 1 , indicating a 0.6% annual risk of stroke.   Patient appears to be maintaining SR. Patient prefers to continue on anticoagulation  for now. Continue Eliquis 5 mg BID. Check CBC. Increase diltiazem to 240 mg daily  2. Obesity Body mass index is 30.36 kg/m. Lifestyle modification was discussed and encouraged including regular physical activity and weight reduction.  3. Snoring/witnessed apnea Sleep study pending 07/23/19.  4. HTN Elevated today, increase diltiazem as above. Patient to keep BP log and call clinic in 2 weeks.   Follow up in the AF clinic in 3 months.   Jorja Loa PA-C [facility] [address] [place], [place] [address] [phone] 06/08/2019 11:02 AM

## 2019-06-08 NOTE — Patient Instructions (Signed)
Increase cardizem 240mg  once a day

## 2019-07-23 ENCOUNTER — Other Ambulatory Visit: Payer: Self-pay

## 2019-07-23 ENCOUNTER — Ambulatory Visit (HOSPITAL_BASED_OUTPATIENT_CLINIC_OR_DEPARTMENT_OTHER): Payer: 59 | Attending: Cardiology | Admitting: Cardiology

## 2019-07-23 DIAGNOSIS — G4730 Sleep apnea, unspecified: Secondary | ICD-10-CM | POA: Diagnosis not present

## 2019-07-23 DIAGNOSIS — R0681 Apnea, not elsewhere classified: Secondary | ICD-10-CM

## 2019-07-23 DIAGNOSIS — G4733 Obstructive sleep apnea (adult) (pediatric): Secondary | ICD-10-CM | POA: Diagnosis not present

## 2019-08-11 NOTE — Procedures (Signed)
   Patient Name: Clarence Pierce, Clarence Pierce Date: 07/23/2019 Gender: Male D.O.B: Mar 20, 1971 Age (years): 85 Referring Provider: Armanda Magic MD, ABSM Height (inches): 69 Interpreting Physician: Armanda Magic MD, ABSM Weight (lbs): 209 RPSGT: Ne[place]er, Holly BMI: 31 MRN: [id number] Neck Size: 16.50  CLINICAL INFORMATION Sleep Study Type: HST  Indication for sleep study: N/A  Epworth Sleepiness Score: 10  SLEEP STUDY TECHNIQUE A multi-channel overnight portable sleep study was performed. The channels recorded were: nasal airflow, thoracic respiratory movement, and oxygen saturation with a pulse oximetry. Snoring was also monitored.  MEDICATIONS Patient self administered medications include: N/A.  SLEEP ARCHITECTURE Patient was studied for 378 minutes. The sleep efficiency was 100.0 % and the patient was supine for 53.1%. The arousal index was 0.0 per hour.  RESPIRATORY PARAMETERS The overall AHI was 36.2 per hour, with a central apnea index of 0.0 per hour.  The oxygen nadir was 60% during sleep.  CARDIAC DATA Mean heart rate during sleep was 84.7 bpm.  IMPRESSIONS - Severe obstructive sleep apnea occurred during this study (AHI = 36.2/h). - No significant central sleep apnea occurred during this study (CAI = 0.0/h). - Severe oxygen desaturation was noted during this study (Min O2 = 60%). - Patient snored 10.5% during the sleep.  DIAGNOSIS - Obstructive Sleep Apnea (327.23 [G47.33 ICD-10]) - Nocturnal Hypoxemia (327.26 [G47.36 ICD-10])  RECOMMENDATIONS - Recommend in lab CPAP titration.  - Positional therapy avoiding supine position during sleep. - Avoid alcohol, sedatives and other CNS depressants that may worsen sleep apnea and disrupt normal sleep architecture. - Sleep hygiene should be reviewed to assess factors that may improve sleep quality. - Weight management and regular exercise should be initiated or continued.  [Electronically signed] 08/11/2019 09:18  PM  Armanda Magic MD, ABSM Diplomate, American Board of Sleep Medicine

## 2019-08-14 ENCOUNTER — Encounter: Payer: Self-pay | Admitting: *Deleted

## 2019-08-14 DIAGNOSIS — G4733 Obstructive sleep apnea (adult) (pediatric): Secondary | ICD-10-CM

## 2019-08-14 NOTE — Telephone Encounter (Signed)
-----   Message from Quintella Reichert, MD sent at 08/11/2019  9:23 PM EDT ----- Please let patient know that they have sleep apnea and recommend CPAP titration. Please set up titration in the sleep lab.

## 2019-09-08 ENCOUNTER — Encounter (HOSPITAL_COMMUNITY): Payer: Self-pay

## 2019-09-08 ENCOUNTER — Ambulatory Visit (HOSPITAL_COMMUNITY): Payer: 59 | Admitting: Physician Assistant

## 2019-09-10 ENCOUNTER — Telehealth: Payer: Self-pay | Admitting: *Deleted

## 2019-09-10 NOTE — Telephone Encounter (Signed)
-----   Message from Traci R Loren, MD sent at 08/11/2019  9:23 PM EDT ----- Please let patient know that they have sleep apnea and recommend CPAP titration. Please set up titration in the sleep lab.  

## 2019-09-10 NOTE — Telephone Encounter (Signed)
Informed patient of sleep study results and patient understanding was verbalized. Patient understands his sleep study showed they have sleep apnea and recommend CPAP titration. Please set up titration in the sleep lab.   Titration sent to sleep pool  

## 2019-09-21 NOTE — Telephone Encounter (Signed)
This encounter was created in error - please disregard.

## 2020-09-11 IMAGING — DX DG CHEST 1V PORT
1 series · 1 of 1 positions shown · non-contrast
Comparison: None.

CLINICAL DATA: 48-year-old male with a history chest pain

EXAM:
PORTABLE CHEST 1 VIEW

[chest ap]
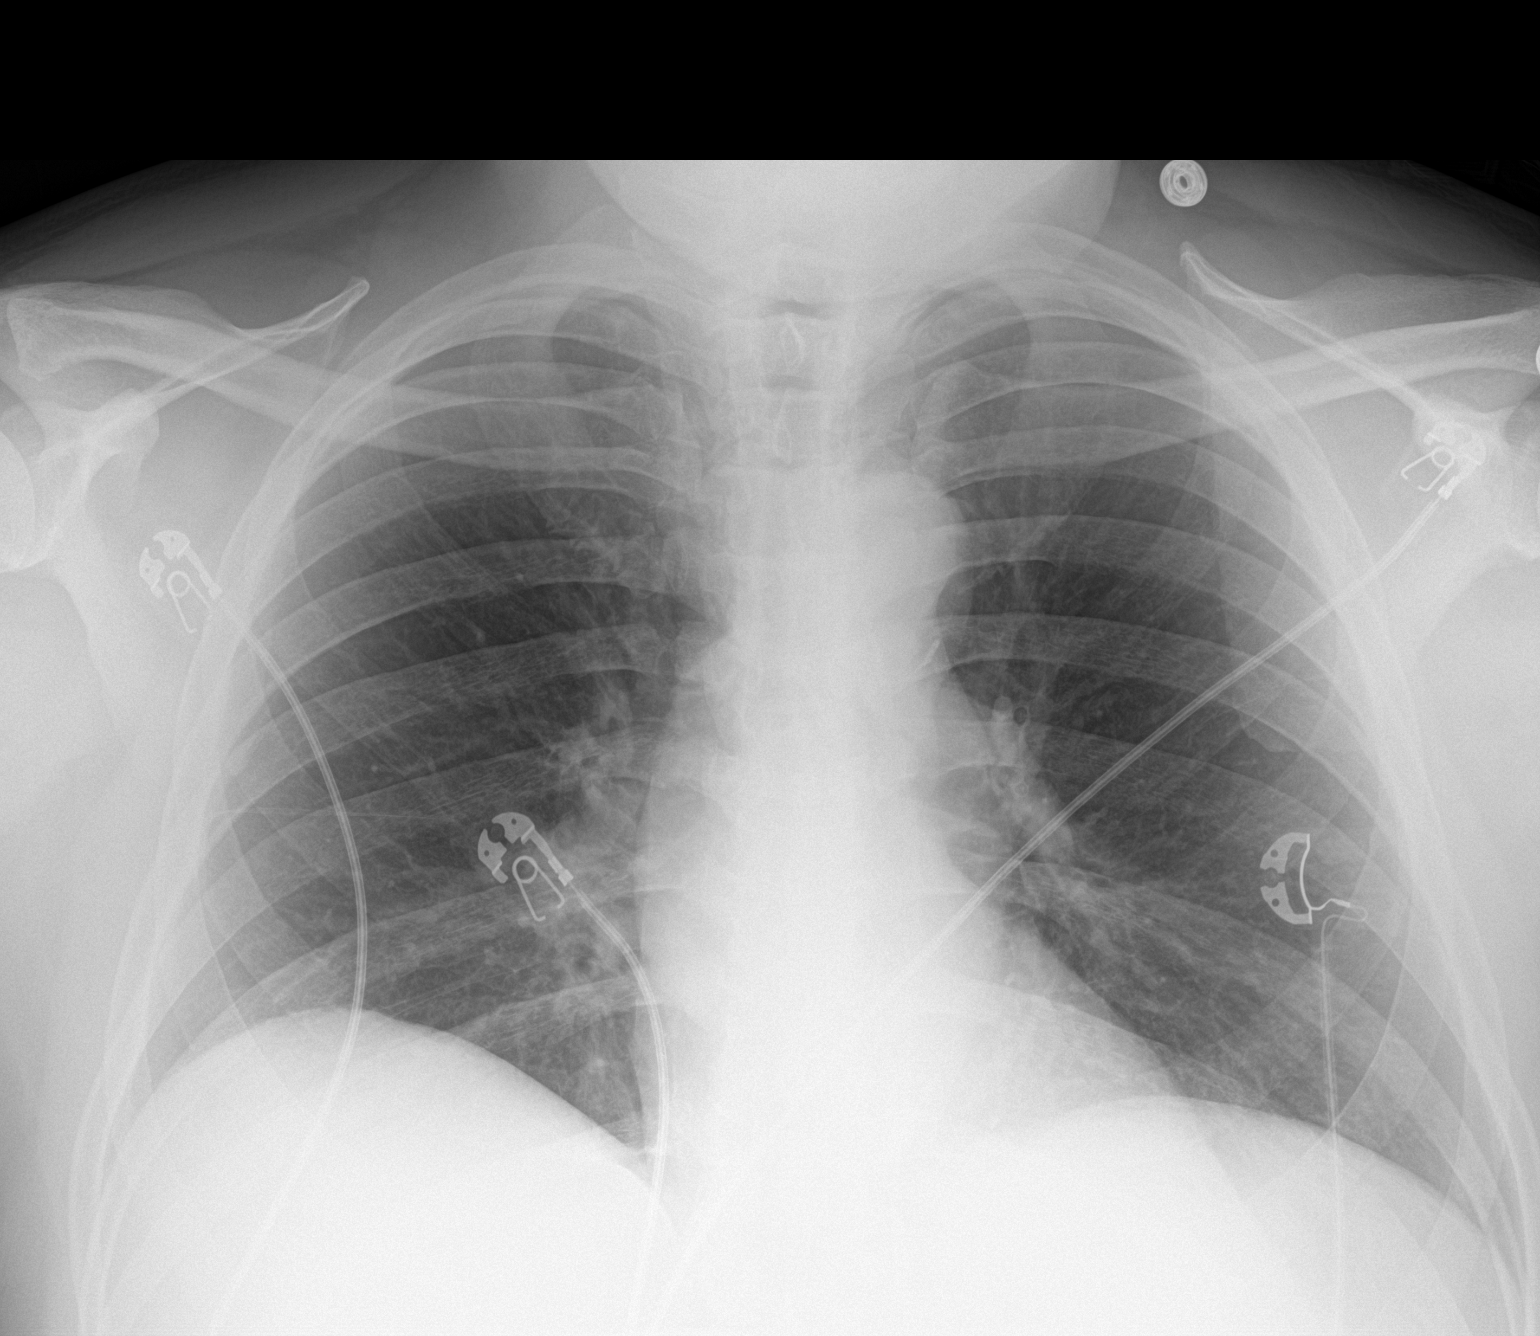

[1 of 1 positions shown; findings below may reference images not displayed]

FINDINGS: The heart size and mediastinal contours are within normal limits.
Both lungs are clear. The visualized skeletal structures are
unremarkable.
IMPRESSION: Negative for acute cardiopulmonary disease

## 2021-06-25 ENCOUNTER — Encounter (HOSPITAL_COMMUNITY): Payer: Self-pay

## 2021-06-25 ENCOUNTER — Other Ambulatory Visit: Payer: Self-pay

## 2021-06-25 ENCOUNTER — Emergency Department (HOSPITAL_COMMUNITY)
Admission: EM | Admit: 2021-06-25 | Discharge: 2021-06-26 | Disposition: A | Discharge status: Home / Self Care | Payer: 59 | Attending: Student | Admitting: Student

## 2021-06-25 DIAGNOSIS — R000 Tachycardia, unspecified: Secondary | ICD-10-CM | POA: Insufficient documentation

## 2021-06-25 DIAGNOSIS — R079 Chest pain, unspecified: Secondary | ICD-10-CM | POA: Diagnosis not present

## 2021-06-25 DIAGNOSIS — Z20822 Contact with and (suspected) exposure to covid-19: Secondary | ICD-10-CM | POA: Diagnosis not present

## 2021-06-25 DIAGNOSIS — I10 Essential (primary) hypertension: Secondary | ICD-10-CM | POA: Diagnosis not present

## 2021-06-25 DIAGNOSIS — Z79899 Other long term (current) drug therapy: Secondary | ICD-10-CM | POA: Insufficient documentation

## 2021-06-25 DIAGNOSIS — R0602 Shortness of breath: Secondary | ICD-10-CM | POA: Diagnosis not present

## 2021-06-25 DIAGNOSIS — R002 Palpitations: Secondary | ICD-10-CM | POA: Insufficient documentation

## 2021-06-25 DIAGNOSIS — I471 Supraventricular tachycardia: Secondary | ICD-10-CM

## 2021-06-25 DIAGNOSIS — R61 Generalized hyperhidrosis: Secondary | ICD-10-CM | POA: Insufficient documentation

## 2021-06-25 DIAGNOSIS — E876 Hypokalemia: Secondary | ICD-10-CM | POA: Diagnosis not present

## 2021-06-25 DIAGNOSIS — F17210 Nicotine dependence, cigarettes, uncomplicated: Secondary | ICD-10-CM | POA: Insufficient documentation

## 2021-06-25 DIAGNOSIS — Z7982 Long term (current) use of aspirin: Secondary | ICD-10-CM | POA: Diagnosis not present

## 2021-06-25 LAB — CBC WITH DIFFERENTIAL/PLATELET
Abs Immature Granulocytes: 0.04 10*3/uL (ref 0.00–0.07)
Basophils Absolute: 0.1 10*3/uL (ref 0.0–0.1)
Basophils Relative: 1 %
Eosinophils Absolute: 0 10*3/uL (ref 0.0–0.5)
Eosinophils Relative: 0 %
HCT: 42 % (ref 39.0–52.0)
Hemoglobin: 14.4 g/dL (ref 13.0–17.0)
Immature Granulocytes: 0 %
Lymphocytes Relative: 21 %
Lymphs Abs: 2.3 10*3/uL (ref 0.7–4.0)
MCH: 29.7 pg (ref 26.0–34.0)
MCHC: 34.3 g/dL (ref 30.0–36.0)
MCV: 86.6 fL (ref 80.0–100.0)
Monocytes Absolute: 0.8 10*3/uL (ref 0.1–1.0)
Monocytes Relative: 8 %
Neutro Abs: 7.7 10*3/uL (ref 1.7–7.7)
Neutrophils Relative %: 70 %
Platelets: 267 10*3/uL (ref 150–400)
RBC: 4.85 MIL/uL (ref 4.22–5.81)
RDW: 15.4 % (ref 11.5–15.5)
WBC: 10.9 10*3/uL — ABNORMAL HIGH (ref 4.0–10.5)
nRBC: 0 % (ref 0.0–0.2)

## 2021-06-25 LAB — COMPREHENSIVE METABOLIC PANEL
ALT: 35 U/L (ref 0–44)
AST: 63 U/L — ABNORMAL HIGH (ref 15–41)
Albumin: 4 g/dL (ref 3.5–5.0)
Alkaline Phosphatase: 71 U/L (ref 38–126)
Anion gap: 15 (ref 5–15)
BUN: 13 mg/dL (ref 6–20)
CO2: 23 mmol/L (ref 22–32)
Calcium: 9.8 mg/dL (ref 8.9–10.3)
Chloride: 102 mmol/L (ref 98–111)
Creatinine, Ser: 1.06 mg/dL (ref 0.61–1.24)
GFR, Estimated: >60 mL/min (ref >60)
Glucose, Bld: 87 mg/dL (ref 70–99)
Potassium: 3 mmol/L — ABNORMAL LOW (ref 3.5–5.1)
Sodium: 140 mmol/L (ref 135–145)
Total Bilirubin: 0.6 mg/dL (ref 0.3–1.2)
Total Protein: 7.1 g/dL (ref 6.5–8.1)

## 2021-06-25 LAB — RESP PANEL BY RT-PCR (FLU A&B, COVID) ARPGX2
Influenza A by PCR: NEGATIVE
Influenza B by PCR: NEGATIVE
SARS Coronavirus 2 by RT PCR: NEGATIVE

## 2021-06-25 MED ORDER — DILTIAZEM HCL 30 MG PO TABS
30.0000 mg | ORAL_TABLET | Freq: Once | ORAL | Status: AC
Start: 2021-06-25 — End: 2021-06-26
  Administered 2021-06-26: 30 mg via ORAL
  Filled 2021-06-25 (×2): qty 1

## 2021-06-25 MED ORDER — LACTATED RINGERS IV BOLUS
1000.0000 mL | Freq: Once | INTRAVENOUS | Status: AC
  Administered 2021-06-25: 1000 mL via INTRAVENOUS

## 2021-06-25 MED ORDER — MAGNESIUM OXIDE -MG SUPPLEMENT 400 (240 MG) MG PO TABS
800.0000 mg | ORAL_TABLET | Freq: Once | ORAL | Status: AC
  Administered 2021-06-25: 800 mg via ORAL
  Filled 2021-06-25: qty 2

## 2021-06-25 MED ORDER — POTASSIUM CHLORIDE 10 MEQ/100ML IV SOLN
10.0000 meq | Freq: Once | INTRAVENOUS | Status: AC
Start: 2021-06-25 — End: 2021-06-25
  Administered 2021-06-25: 10 meq via INTRAVENOUS
  Filled 2021-06-25: qty 100

## 2021-06-25 MED ORDER — POTASSIUM CHLORIDE CRYS ER 20 MEQ PO TBCR
40.0000 meq | EXTENDED_RELEASE_TABLET | Freq: Once | ORAL | Status: AC
Start: 2021-06-25 — End: 2021-06-25
  Administered 2021-06-25: 40 meq via ORAL
  Filled 2021-06-25: qty 2

## 2021-06-25 NOTE — ED Provider Notes (Signed)
?[facility] EMERGENCY DEPARTMENT ?Provider Note ? ?CSN: [id number] ?Arrival date & time: 06/25/21 1823 ? ?Chief Complaint(s) ?No chief complaint on file. ? ?HPI ?Clarence Pierce is a 51 y.o. male with PMH paroxysmal A-fib on diltiazem who presents emergency department for evaluation of SVT.  Apparently had a previous episode many years ago where he required emergent cardioversion, but he is not on any additional rate control therapy outside of his diltiazem.  He is currently not anticoagulated.  He states that at approximately 3 PM today he had sudden onset palpitations, chest pain and shortness of breath worse with exertion.  EMS was called and found the patient to be in SVT with rates greater than 220.  He was given 3 doses of adenosine with no effect.  Patient then had decompensation with diaphoresis and hypotension where he was emergently cardioverted with 100 J and he reverted to normal sinus rhythm.  He arrives asymptomatic with sinus tachycardia in the 110's but no complaints of chest pain, shortness of breath, abdominal pain or nausea, vomiting or other systemic symptoms. ? ?HPI ? ?Past Medical History ?Past Medical History:  ?Diagnosis Date  ? Hypertension   ? ?Patient Active Problem List  ? Diagnosis Date Noted  ? Paroxysmal atrial fibrillation (HCC) 05/07/2019  ? ?Home Medication(s) ?Prior to Admission medications   ?Medication Sig Start Date End Date Taking? Authorizing Provider  ?amLODipine (NORVASC) 10 MG tablet Take 10 mg by mouth daily. 12/02/20  Yes [provider]  ?aspirin 325 MG tablet Take 325 mg by mouth daily. Take along with 81 mg   Yes [provider]  ?aspirin EC 81 MG tablet Take 81 mg by mouth daily. Swallow whole. Take along with 325 mg   Yes [provider]  ?diltiazem (TIAZAC) 180 MG 24 hr capsule Take 180 mg by mouth daily. 06/21/21  Yes [provider]  ?apixaban (ELIQUIS) 5 MG TABS tablet Take 1 tablet (5 mg total) by mouth 2 (two)  times daily. ?Patient not taking: Reported on 06/25/2021 05/07/19 06/08/19  Fenton, Levonne Spiller R, PA  ?diltiazem (CARDIZEM CD) 240 MG 24 hr capsule Take 1 capsule (240 mg total) by mouth daily. ?Patient not taking: Reported on 06/25/2021 06/08/19 06/07/20  Fenton, Levonne Spiller R, PA  ?losartan (COZAAR) 50 MG tablet Taking 1/2 tablet daily ?Patient not taking: Reported on 06/25/2021    [provider]  ?                                                                                                                                  ?Past Surgical History ?Past Surgical History:  ?Procedure Laterality Date  ? KNEE CARTILAGE SURGERY    ? ?Family History ?Family History  ?Problem Relation Age of Onset  ? Cancer Mother   ? Hypertension Father   ? ? ?Social History ?Social History  ? ?Tobacco Use  ? Smoking status: Every Day  ?  Packs/day: 0.50  ?  Years: 30.00  ?  Pack years: 15.00  ?  Types: Cigarettes  ? Smokeless tobacco: Never  ?Substance Use Topics  ? Alcohol use: Yes  ?  Comment: 3 drinks at night  ? Drug use: Never  ? ?Allergies ?Patient has no known allergies. ? ?Review of Systems ?Review of Systems  ?Respiratory:  Positive for shortness of breath.   ?Cardiovascular:  Positive for chest pain.  ? ?Physical Exam ?Vital Signs  ?I have reviewed the triage vital signs ?BP 113/84   Pulse (!) 112   Temp 98.7 ?F (37.1 ?C) (Oral)   Resp 14   Ht 5\' 9"  (1.753 m)   Wt 95.3 kg   SpO2 98%   BMI 31.03 kg/m?  ? ?Physical Exam ?Vitals and nursing note reviewed.  ?Constitutional:   ?   General: He is not in acute distress. ?   Appearance: He is well-developed.  ?HENT:  ?   Head: Normocephalic and atraumatic.  ?Eyes:  ?   Conjunctiva/sclera: Conjunctivae normal.  ?Cardiovascular:  ?   Rate and Rhythm: Regular rhythm. Tachycardia present.  ?   Heart sounds: No murmur heard. ?Pulmonary:  ?   Effort: Pulmonary effort is normal. No respiratory distress.  ?   Breath sounds: Normal breath sounds.  ?Abdominal:  ?   Palpations: Abdomen is soft.   ?   Tenderness: There is no abdominal tenderness.  ?Musculoskeletal:     ?   General: No swelling.  ?   Cervical back: Neck supple.  ?Skin: ?   General: Skin is warm and dry.  ?   Capillary Refill: Capillary refill takes less than 2 seconds.  ?Neurological:  ?   Mental Status: He is alert.  ?Psychiatric:     ?   Mood and Affect: Mood normal.  ? ? ?ED Results and Treatments ?Labs ?(all labs ordered are listed, but only abnormal results are displayed) ?Labs Reviewed  ?COMPREHENSIVE METABOLIC PANEL - Abnormal; Notable for the following components:  ?    Result Value  ? Potassium 3.0 (*)   ? AST 63 (*)   ? All other components within normal limits  ?CBC WITH DIFFERENTIAL/PLATELET - Abnormal; Notable for the following components:  ? WBC 10.9 (*)   ? All other components within normal limits  ?RESP PANEL BY RT-PCR (FLU A&B, COVID) ARPGX2  ?POTASSIUM  ?                                                                                                                       ? ?Radiology ?No results found. ? ?Pertinent labs & imaging results that were available during my care of the patient were reviewed by me and considered in my medical decision making (see MDM for details). ? ?Medications Ordered in ED ?Medications  ?diltiazem (CARDIZEM) tablet 30 mg (has no administration in time range)  ?lactated ringers bolus 1,000 mL (0 mLs Intravenous Stopped 06/25/21 2205)  ?potassium chloride SA (KLOR-CON M) CR  tablet 40 mEq (40 mEq Oral Given 06/25/21 2157)  ?magnesium oxide (MAG-OX) tablet 800 mg (800 mg Oral Given 06/25/21 2157)  ?potassium chloride 10 mEq in 100 mL IVPB (10 mEq Intravenous New Bag/Given 06/25/21 2203)  ?                                                               ?                                                                    ?Procedures ?Procedures ? ?(including critical care time) ? ?Medical Decision Making / ED Course ? ? ?This patient presents to the ED for concern of SVT, this involves an extensive number  of treatment options, and is a complaint that carries with it a high risk of complications and morbidity.  The differential diagnosis includes SVT, a flutter, electrolyte abnormality, dehydration ? ?MDM: ?Patient seen emergency part for evaluation of SVT.  Physical exam reveals a regular tachycardia but is otherwise unremarkable.  Laboratory evaluation with hypokalemia to 3.0 but is otherwise unremarkable.  EKG with sinus tachycardia.  Cardiology consulted who recommended repletion of potassium to greater than 3.5 prior to discharge.  Cardiology okay with persistent tachycardia in the 110's but is requesting an additional dose of 30 mg oral diltiazem.  Patient repleted with IV and oral potassium as well as oral magnesium and was signed out to oncoming provider with potassium recheck pending.  If potassium not greater than 3.5 will require overnight observation for repletion of electrolytes. ? ? ?Additional history obtained: ? ?-External records from outside source obtained and reviewed including: Chart review including previous notes, labs, imaging, consultation notes ? ? ?Lab Tests: ?-I ordered, reviewed, and interpreted labs.   ?The pertinent results include:   ?Labs Reviewed  ?COMPREHENSIVE METABOLIC PANEL - Abnormal; Notable for the following components:  ?    Result Value  ? Potassium 3.0 (*)   ? AST 63 (*)   ? All other components within normal limits  ?CBC WITH DIFFERENTIAL/PLATELET - Abnormal; Notable for the following components:  ? WBC 10.9 (*)   ? All other components within normal limits  ?RESP PANEL BY RT-PCR (FLU A&B, COVID) ARPGX2  ?POTASSIUM  ?  ? ? ?EKG  ? EKG Interpretation ? ?Date/Time:  Sunday June 25 2021 18:34:12 EDT ?Ventricular Rate:  116 ?PR Interval:  161 ?QRS Duration: 87 ?QT Interval:  336 ?QTC Calculation: 467 ?R Axis:   -26 ?Text Interpretation: Sinus tachycardia Confirmed by Kylie Gros [phone]) on 06/25/2021 7:46:41 PM ?  ? ?  ? ? ? ? ?Medicines ordered and prescription drug  management: ?Meds ordered this encounter  ?Medications  ? lactated ringers bolus 1,000 mL  ? potassium chloride SA (KLOR-CON M) CR tablet 40 mEq  ? magnesium oxide (MAG-OX) tablet 800 mg  ? diltiazem (CARDIZEM) tabl

## 2021-06-25 NOTE — ED Triage Notes (Signed)
Pt bib [facility] from home with complaints of chest tightness that started this morning after walking downstairs in his home. Pt states that this has happened before and he needed shocked. EMS found pt to be in SVT with a rate of 230, pt given 6mg , 12mg , 12mg  of Adenosine with no success. Pt blood pressure dropped to 78/50 from 106/58 originally. Pt then shocked at [facility] with a heart rate from 230 to 103 with a pressure of 100/95.Pt arrives AOx4 with no complaints at this time ?

## 2021-06-25 NOTE — ED Provider Notes (Signed)
Care assumed from Dr. Posey Rea, patient with an episode of SVT noted to be hypokalemic.  He is currently getting potassium replacement and repeat potassium level is pending.  Cardiology has stated that he should not go home unless his potassium is 3.5 or greater. ? ?Repeat potassium is only come up to 3.2.  He is given an additional run of intravenous potassium as well as additional oral potassium. ? ?Following this, potassium is still only at 3.2.  He will be given additional 2 rounds of intravenous potassium and additional oral potassium. ? ?Potassium level following above-noted treatment is pending.  Case is signed out to Dr. Gwenlyn Fudge. ?  ?Dione Booze, MD ?06/26/21 0725 ? ?

## 2021-06-25 NOTE — ED Notes (Signed)
Pt resting comfortably and reports no chest pain or tightness. RR even and unlabored. Axo x4 ?

## 2021-06-26 LAB — POTASSIUM
Potassium: 3.2 mmol/L — ABNORMAL LOW (ref 3.5–5.1)
Potassium: 3.2 mmol/L — ABNORMAL LOW (ref 3.5–5.1)
Potassium: 4 mmol/L (ref 3.5–5.1)

## 2021-06-26 MED ORDER — POTASSIUM CHLORIDE 10 MEQ/100ML IV SOLN
10.0000 meq | Freq: Once | INTRAVENOUS | Status: AC
Start: 2021-06-26 — End: 2021-06-26
  Administered 2021-06-26: 10 meq via INTRAVENOUS
  Filled 2021-06-26: qty 100

## 2021-06-26 MED ORDER — POTASSIUM CHLORIDE 10 MEQ/100ML IV SOLN
10.0000 meq | INTRAVENOUS | Status: AC
  Administered 2021-06-26 (×2): 10 meq via INTRAVENOUS
  Filled 2021-06-26 (×2): qty 100

## 2021-06-26 MED ORDER — POTASSIUM CHLORIDE CRYS ER 20 MEQ PO TBCR
40.0000 meq | EXTENDED_RELEASE_TABLET | Freq: Once | ORAL | Status: AC
  Administered 2021-06-26: 40 meq via ORAL
  Filled 2021-06-26: qty 2

## 2021-06-26 MED ORDER — POTASSIUM CHLORIDE CRYS ER 20 MEQ PO TBCR
40.0000 meq | EXTENDED_RELEASE_TABLET | Freq: Once | ORAL | Status: AC
Start: 2021-06-26 — End: 2021-06-26
  Administered 2021-06-26: 40 meq via ORAL
  Filled 2021-06-26: qty 2

## 2021-06-26 NOTE — ED Provider Notes (Signed)
Care transferred to me.  Patient remains asymptomatic.  Potassium has come up to 4.0 after treatments from the previous team.  No obvious cause for why his potassium would be low though he indicates this is happened to him before.  At this point with his potassium being normal I do not think he needs extra potassium supplementation but will need to follow-up with PCP for recheck to ensure is not dropping again as well as for cardiology follow-up at the [place].  Given return precautions. ?  ?Pricilla Loveless, MD ?06/26/21 [phone] ? ?

## 2021-06-26 NOTE — Discharge Instructions (Signed)
If you develop recurrent palpitations or if you develop chest pain, shortness of breath, vomiting, or any other new/concerning symptoms return to the ER for evaluation ?

## 2021-06-27 ENCOUNTER — Emergency Department (HOSPITAL_COMMUNITY)
Admission: EM | Admit: 2021-06-27 | Discharge: 2021-06-27 | Disposition: A | Discharge status: Home / Self Care | Payer: 59 | Attending: Emergency Medicine | Admitting: Emergency Medicine

## 2021-06-27 ENCOUNTER — Emergency Department (HOSPITAL_COMMUNITY): Payer: 59

## 2021-06-27 ENCOUNTER — Other Ambulatory Visit: Payer: Self-pay

## 2021-06-27 DIAGNOSIS — R072 Precordial pain: Secondary | ICD-10-CM | POA: Diagnosis present

## 2021-06-27 DIAGNOSIS — I471 Supraventricular tachycardia: Secondary | ICD-10-CM

## 2021-06-27 DIAGNOSIS — R7309 Other abnormal glucose: Secondary | ICD-10-CM | POA: Insufficient documentation

## 2021-06-27 DIAGNOSIS — Z7982 Long term (current) use of aspirin: Secondary | ICD-10-CM | POA: Insufficient documentation

## 2021-06-27 DIAGNOSIS — R0602 Shortness of breath: Secondary | ICD-10-CM | POA: Diagnosis not present

## 2021-06-27 LAB — BASIC METABOLIC PANEL
Anion gap: 13 (ref 5–15)
BUN: 11 mg/dL (ref 6–20)
CO2: 21 mmol/L — ABNORMAL LOW (ref 22–32)
Calcium: 9 mg/dL (ref 8.9–10.3)
Chloride: 108 mmol/L (ref 98–111)
Creatinine, Ser: 1.55 mg/dL — ABNORMAL HIGH (ref 0.61–1.24)
GFR, Estimated: 53 mL/min — ABNORMAL LOW (ref >60)
Glucose, Bld: 150 mg/dL — ABNORMAL HIGH (ref 70–99)
Potassium: 3.7 mmol/L (ref 3.5–5.1)
Sodium: 142 mmol/L (ref 135–145)

## 2021-06-27 LAB — TROPONIN I (HIGH SENSITIVITY)
Troponin I (High Sensitivity): 58 ng/L — ABNORMAL HIGH (ref <18)
Troponin I (High Sensitivity): 54 ng/L — ABNORMAL HIGH (ref <18)

## 2021-06-27 LAB — CBC WITH DIFFERENTIAL/PLATELET
Abs Immature Granulocytes: 0.05 10*3/uL (ref 0.00–0.07)
Basophils Absolute: 0 10*3/uL (ref 0.0–0.1)
Basophils Relative: 1 %
Eosinophils Absolute: 0 10*3/uL (ref 0.0–0.5)
Eosinophils Relative: 0 %
HCT: 37.5 % — ABNORMAL LOW (ref 39.0–52.0)
Hemoglobin: 12.4 g/dL — ABNORMAL LOW (ref 13.0–17.0)
Immature Granulocytes: 1 %
Lymphocytes Relative: 22 %
Lymphs Abs: 1.6 10*3/uL (ref 0.7–4.0)
MCH: 29.6 pg (ref 26.0–34.0)
MCHC: 33.1 g/dL (ref 30.0–36.0)
MCV: 89.5 fL (ref 80.0–100.0)
Monocytes Absolute: 0.6 10*3/uL (ref 0.1–1.0)
Monocytes Relative: 8 %
Neutro Abs: 5.1 10*3/uL (ref 1.7–7.7)
Neutrophils Relative %: 68 %
Platelets: 245 10*3/uL (ref 150–400)
RBC: 4.19 MIL/uL — ABNORMAL LOW (ref 4.22–5.81)
RDW: 15.7 % — ABNORMAL HIGH (ref 11.5–15.5)
WBC: 7.3 10*3/uL (ref 4.0–10.5)
nRBC: 0 % (ref 0.0–0.2)

## 2021-06-27 LAB — CBG MONITORING, ED: Glucose-Capillary: 121 mg/dL — ABNORMAL HIGH (ref 70–99)

## 2021-06-27 MED ORDER — FENTANYL CITRATE PF 50 MCG/ML IJ SOSY
50.0000 ug | PREFILLED_SYRINGE | Freq: Once | INTRAMUSCULAR | Status: DC
Start: 2021-06-27 — End: 2021-06-27

## 2021-06-27 MED ORDER — MIDAZOLAM HCL 2 MG/2ML IJ SOLN: 2.0000 mg | Freq: Once | INTRAMUSCULAR | Status: DC

## 2021-06-27 MED ORDER — MIDAZOLAM HCL 2 MG/2ML IJ SOLN
INTRAMUSCULAR | Status: AC
  Administered 2021-06-27: 2 mg via INTRAVENOUS
  Filled 2021-06-27: qty 2

## 2021-06-27 MED ORDER — ADENOSINE 6 MG/2ML IV SOLN
6.0000 mg | Freq: Once | INTRAVENOUS | Status: AC
  Administered 2021-06-27: 6 mg via INTRAVENOUS

## 2021-06-27 MED ORDER — ADENOSINE 6 MG/2ML IV SOLN
12.0000 mg | Freq: Once | INTRAVENOUS | Status: AC
  Administered 2021-06-27: 12 mg via INTRAVENOUS

## 2021-06-27 MED ORDER — ADENOSINE 6 MG/2ML IV SOLN
INTRAVENOUS | Status: AC
  Filled 2021-06-27: qty 6

## 2021-06-27 MED ORDER — NOREPINEPHRINE 4 MG/250ML-% IV SOLN
INTRAVENOUS | Status: AC
  Filled 2021-06-27: qty 250

## 2021-06-27 MED ORDER — SODIUM CHLORIDE 0.9 % IV BOLUS
500.0000 mL | Freq: Once | INTRAVENOUS | Status: AC
  Administered 2021-06-27: 500 mL via INTRAVENOUS

## 2021-06-27 MED ORDER — FENTANYL CITRATE PF 50 MCG/ML IJ SOSY
PREFILLED_SYRINGE | INTRAMUSCULAR | Status: AC
  Administered 2021-06-27: 50 ug
  Filled 2021-06-27: qty 1

## 2021-06-27 NOTE — ED Provider Notes (Signed)
?[facility] EMERGENCY DEPARTMENT ?Provider Note ? ? ?CSN: [id number] ?Arrival date & time: 06/27/21  9702 ? ?  ? ?History ? ?Chief Complaint  ?Patient presents with  ? Chest Pain  ? Shortness of Breath  ? Palpitations  ? ? ?Clarence Pierce is a 51 y.o. male. ? ?Pt is a 51 yo male recently seen in ED for SVT with cardioversion to NSR presenting for sob. Pt admits to acute onset sob and sternal chest pain without radiation that stated while walking to the bathroom at 0630 this morning.  ? ?HR 220 bpm on arrival. BP 145/70 ? ?The history is provided by the patient. No language interpreter was used.  ?Chest Pain ?Associated symptoms: palpitations and shortness of breath   ?Associated symptoms: no abdominal pain, no back pain, no cough, no fever and no vomiting   ?Shortness of Breath ?Associated symptoms: chest pain   ?Associated symptoms: no abdominal pain, no cough, no ear pain, no fever, no rash, no sore throat and no vomiting   ?Palpitations ?Associated symptoms: chest pain and shortness of breath   ?Associated symptoms: no back pain, no cough and no vomiting   ? ?  ? ?Home Medications ?Prior to Admission medications   ?Medication Sig Start Date End Date Taking? Authorizing Provider  ?amLODipine (NORVASC) 10 MG tablet Take 10 mg by mouth daily. 12/02/20  Yes [provider]  ?aspirin 325 MG tablet Take 325 mg by mouth daily. Take along with 81 mg   Yes [provider]  ?aspirin EC 81 MG tablet Take 81 mg by mouth daily. Swallow whole. Take along with 325 mg   Yes [provider]  ?diltiazem (TIAZAC) 180 MG 24 hr capsule Take 180 mg by mouth daily. 06/21/21  Yes [provider]  ?apixaban (ELIQUIS) 5 MG TABS tablet Take 1 tablet (5 mg total) by mouth 2 (two) times daily. ?Patient not taking: Reported on 06/25/2021 05/07/19 06/08/19  Fenton, Levonne Spiller R, PA  ?diltiazem (CARDIZEM CD) 240 MG 24 hr capsule Take 1 capsule (240 mg total) by mouth daily. ?Patient not taking:  Reported on 06/25/2021 06/08/19 06/07/20  Alphonzo Severance R, PA  ?   ? ?Allergies    ?Patient has no known allergies.   ? ?Review of Systems   ?Review of Systems  ?Constitutional:  Negative for chills and fever.  ?HENT:  Negative for ear pain and sore throat.   ?Eyes:  Negative for pain and visual disturbance.  ?Respiratory:  Positive for shortness of breath. Negative for cough.   ?Cardiovascular:  Positive for chest pain and palpitations.  ?Gastrointestinal:  Negative for abdominal pain and vomiting.  ?Genitourinary:  Negative for dysuria and hematuria.  ?Musculoskeletal:  Negative for arthralgias and back pain.  ?Skin:  Negative for color change and rash.  ?Neurological:  Negative for seizures and syncope.  ?All other systems reviewed and are negative. ? ?Physical Exam ?Updated Vital Signs ?BP 111/89   Pulse 82   Temp (!) 97.4 ?F (36.3 ?C) (Oral)   Resp 15   SpO2 100%  ?Physical Exam ?Vitals and nursing note reviewed.  ?Constitutional:   ?   General: He is in acute distress.  ?   Appearance: He is well-developed.  ?HENT:  ?   Head: Normocephalic and atraumatic.  ?Eyes:  ?   Conjunctiva/sclera: Conjunctivae normal.  ?Cardiovascular:  ?   Rate and Rhythm: Regular rhythm. Tachycardia present.  ?   Pulses:     ?  Radial pulses are 2+ on the right side and 2+ on the left side.  ?   Heart sounds: No murmur heard. ?Pulmonary:  ?   Effort: Pulmonary effort is normal. No respiratory distress.  ?   Breath sounds: Normal breath sounds.  ?Abdominal:  ?   Palpations: Abdomen is soft.  ?   Tenderness: There is no abdominal tenderness.  ?Musculoskeletal:     ?   General: No swelling.  ?   Cervical back: Neck supple.  ?Skin: ?   General: Skin is warm and dry.  ?   Capillary Refill: Capillary refill takes less than 2 seconds.  ?Neurological:  ?   Mental Status: He is alert and oriented to person, place, and time.  ?   GCS: GCS eye subscore is 4. GCS verbal subscore is 5. GCS motor subscore is 6.  ?Psychiatric:     ?   Mood and  Affect: Mood normal.  ? ? ?ED Results / Procedures / Treatments   ?Labs ?(all labs ordered are listed, but only abnormal results are displayed) ?Labs Reviewed  ?CBC WITH DIFFERENTIAL/PLATELET - Abnormal; Notable for the following components:  ?    Result Value  ? RBC 4.19 (*)   ? Hemoglobin 12.4 (*)   ? HCT 37.5 (*)   ? RDW 15.7 (*)   ? All other components within normal limits  ?BASIC METABOLIC PANEL - Abnormal; Notable for the following components:  ? CO2 21 (*)   ? Glucose, Bld 150 (*)   ? Creatinine, Ser 1.55 (*)   ? GFR, Estimated 53 (*)   ? All other components within normal limits  ?CBG MONITORING, ED - Abnormal; Notable for the following components:  ? Glucose-Capillary 121 (*)   ? All other components within normal limits  ?TROPONIN I (HIGH SENSITIVITY) - Abnormal; Notable for the following components:  ? Troponin I (High Sensitivity) 58 (*)   ? All other components within normal limits  ?TROPONIN I (HIGH SENSITIVITY) - Abnormal; Notable for the following components:  ? Troponin I (High Sensitivity) 54 (*)   ? All other components within normal limits  ? ? ?EKG ?EKG Interpretation ? ?Date/Time:  Tuesday June 27 2021 09:38:04 EDT ?Ventricular Rate:  96 ?PR Interval:  173 ?QRS Duration: 79 ?QT Interval:  316 ?QTC Calculation: 400 ?R Axis:   -19 ?Text Interpretation: Sinus rhythm Borderline left axis deviation Confirmed by Edwin DadaGray, Tyrianna Lightle ([id number]) on 06/27/2021 10:15:25 AM ? ?Radiology ?DG Chest Portable 1 View ? ?Result Date: 06/27/2021 ?CLINICAL DATA:  Chest pain and shortness of breath for several days. EXAM: PORTABLE CHEST 1 VIEW COMPARISON:  04/06/2019 FINDINGS: The heart size and mediastinal contours are within normal limits. Aortic atherosclerotic calcification noted. Low lung volumes are noted on today's exam. Mild streaky opacity in both lung bases is likely due to mild subsegmental atelectasis. No evidence of pleural effusion. The visualized skeletal structures are unremarkable. IMPRESSION: Low lung  volumes, and probable mild bibasilar subsegmental atelectasis. Electronically Signed   By: Danae OrleansJohn A Stahl M.D.   On: 06/27/2021 10:08   ? ?Procedures ?Marland Kitchen.Critical Care ?Performed by: Franne FortsGray, Bonny Egger P, DO ?Authorized by: Franne FortsGray, Orlondo Holycross P, DO  ? ?Critical care provider statement:  ?  Critical care time (minutes):  76 ?  Critical care was necessary to treat or prevent imminent or life-threatening deterioration of the following conditions:  Cardiac failure and shock ?  Critical care was time spent personally by me on the following activities:  Development of treatment plan  with patient or surrogate, discussions with consultants, evaluation of patient's response to treatment, examination of patient, ordering and review of laboratory studies, ordering and review of radiographic studies, ordering and performing treatments and interventions, pulse oximetry, re-evaluation of patient's condition and review of old charts ?  Care discussed with comment:  Cardiology ?.Cardioversion ? ?Date/Time: 06/27/2021 12:36 PM ?Performed by: Franne Forts, DO ?Authorized by: Franne Forts, DO  ? ?Consent:  ?  Consent obtained:  Verbal and emergent situation ?  Consent given by:  Patient ?  Alternatives discussed:  No treatment ?Pre-procedure details:  ?  Cardioversion basis:  Emergent ?  Rhythm:  Supraventricular tachycardia ?Attempt one:  ?  Cardioversion mode:  Synchronous ?  Shock (Joules):  50 ?  Shock outcome:  Conversion to normal sinus rhythm ?Post-procedure details:  ?  Patient status:  Awake ?  Patient tolerance of procedure:  Tolerated well, no immediate complications  ? ? ?Medications Ordered in ED ?Medications  ?adenosine (ADENOCARD) 6 MG/2ML injection (  Not Given 06/27/21 1028)  ?norepinephrine (LEVOPHED) 4-5 MG/250ML-% infusion SOLN (0 mcg/kg/min  Hold 06/27/21 1029)  ?fentaNYL (SUBLIMAZE) injection 50 mcg (50 mcg Intravenous Not Given 06/27/21 1028)  ?midazolam (VERSED) injection 2 mg (2 mg Intravenous Not Given 06/27/21 1027)   ?adenosine (ADENOCARD) 6 MG/2ML injection 6 mg (6 mg Intravenous Given 06/27/21 0930)  ?adenosine (ADENOCARD) 6 MG/2ML injection 12 mg (12 mg Intravenous Given 06/27/21 0932)  ?midazolam (VERSED) 2 MG/2ML injection (2 mg Intraven

## 2021-06-27 NOTE — ED Triage Notes (Signed)
Pt. Stated, I was just here on Sunday with the same symptoms. Chest pain, hard to breath and heart beating fast . ?

## 2021-06-27 NOTE — Discharge Instructions (Signed)
I have spoken with the cardiologist at [facility]. They are working on getting you a sooner appointment with an electrophysiologist who specializes in SVT. If you do not receive a phone call in the next 3 days for an appointment please call them.  ?

## 2021-06-27 NOTE — Progress Notes (Signed)
RT present for emergent cardioversion.  Patient tolerated procedure well.  Sats currently 100% on NRB.  No complications noted at this time.  ?

## 2021-06-27 NOTE — ED Notes (Signed)
50 J synchronized cardioversion delivered by Dr. Wallace Cullens. Pt returned to 97 BPM- NSR. EKG captured.  ?

## 2021-06-27 NOTE — ED Notes (Signed)
ETCO2 not placed due to the emergent nature of cardioversion.  ?

## 2021-06-27 NOTE — ED Notes (Signed)
Notified Dr. Wallace Cullens of pt's BP 98 and 100 systolic. MAP 89. Verbal given for 500 of NS.  ?

## 2021-07-19 ENCOUNTER — Encounter: Payer: Self-pay | Admitting: Cardiology

## 2021-09-22 ENCOUNTER — Ambulatory Visit (INDEPENDENT_AMBULATORY_CARE_PROVIDER_SITE_OTHER): Payer: Commercial Managed Care - PPO | Admitting: Physician Assistant

## 2021-09-22 ENCOUNTER — Encounter: Payer: Self-pay | Admitting: Physician Assistant

## 2021-09-22 DIAGNOSIS — M25562 Pain in left knee: Secondary | ICD-10-CM

## 2021-09-22 DIAGNOSIS — M79662 Pain in left lower leg: Secondary | ICD-10-CM

## 2021-09-22 NOTE — Progress Notes (Signed)
Office Visit Note   Patient: Clarence Pierce           Date of Birth: [birth year]           MRN: [id number] Visit Date: 09/22/2021              Requested by: [facility] 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  He has recently had reconstructive surgery on his left leg by the [place].  He comes in here requesting disability paperwork that he was told should be signed by a private orthopedist.  Assessment & Plan: Visit Diagnoses: No diagnosis found.  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  He understood.  Follow-up as needed  Follow-Up Instructions: No follow-ups on file.   Ortho Exam  Patient is alert, oriented, no adenopathy, well-dressed, normal affect, normal respiratory effort. Well-healed surgical scar scar left lower extremity  Imaging: No results found. No images are attached to the encounter.  Labs: No results found for: "HGBA1C", "ESRSEDRATE", "CRP", "LABURIC", "REPTSTATUS", "GRAMSTAIN", "CULT", "LABORGA"   Lab Results  Component Value Date   ALBUMIN 4.0 06/25/2021    Lab Results  Component Value Date   MG 1.8 04/06/2019   No results found for: "VD25OH"  No results found for: "PREALBUMIN"    Latest Ref Rng & Units 06/27/2021   10:00 AM 06/25/2021    8:00 PM 06/08/2019   12:04 PM  CBC EXTENDED  WBC 4.0 - 10.5 K/uL 7.3  10.9  7.4   RBC 4.22 - 5.81 MIL/uL 4.19  4.85  4.56   Hemoglobin 13.0 - 17.0 g/dL 52.0  80.2  23.3   HCT 39.0 - 52.0 % 37.5  42.0  42.1   Platelets 150 - 400 K/uL 245  267  200   NEUT# 1.7 - 7.7 K/uL 5.1  7.7     Lymph# 0.7 - 4.0 K/uL 1.6  2.3       There is no height or weight on file to calculate BMI.  Orders:  No orders of the defined types were placed in this encounter.  No orders of the defined types were placed in this encounter.    Procedures: No procedures performed  Clinical Data: No additional findings.  ROS:  All other systems negative, except as noted in the HPI. Review of Systems  Objective: Vital Signs: There were no vitals taken for this visit.  Specialty Comments:  No specialty comments available.  PMFS History: Patient Active Problem List   Diagnosis Date Noted   Paroxysmal atrial fibrillation (HCC) 05/07/2019   Past Medical History:  Diagnosis Date   Hypertension     Family History  Problem Relation Age of Onset   Cancer Mother    Hypertension Father     Past Surgical History:  Procedure Laterality Date   KNEE CARTILAGE SURGERY     Social History   Occupational History   Not on file  Tobacco Use   Smoking status: Every Day    Packs/day: 0.50    Years: 30.00  Total pack years: 15.00    Types: Cigarettes   Smokeless tobacco: Never  Substance and Sexual Activity   Alcohol use: Yes    Comment: 3 drinks at night   Drug use: Never   Sexual activity: Not on file

## 2023-09-19 ENCOUNTER — Inpatient Hospital Stay (HOSPITAL_COMMUNITY)

## 2023-09-19 ENCOUNTER — Emergency Department (HOSPITAL_COMMUNITY)

## 2023-09-19 ENCOUNTER — Inpatient Hospital Stay (HOSPITAL_COMMUNITY)
Admission: EM | Admit: 2023-09-19 | Discharge: 2023-10-08 | DRG: 208 | Disposition: E | Attending: Pulmonary Disease | Admitting: Pulmonary Disease

## 2023-09-19 DIAGNOSIS — R7401 Elevation of levels of liver transaminase levels: Secondary | ICD-10-CM | POA: Diagnosis present

## 2023-09-19 DIAGNOSIS — I5020 Unspecified systolic (congestive) heart failure: Secondary | ICD-10-CM | POA: Diagnosis not present

## 2023-09-19 DIAGNOSIS — Z7901 Long term (current) use of anticoagulants: Secondary | ICD-10-CM

## 2023-09-19 DIAGNOSIS — I255 Ischemic cardiomyopathy: Secondary | ICD-10-CM | POA: Diagnosis present

## 2023-09-19 DIAGNOSIS — F1010 Alcohol abuse, uncomplicated: Secondary | ICD-10-CM | POA: Diagnosis present

## 2023-09-19 DIAGNOSIS — I5022 Chronic systolic (congestive) heart failure: Secondary | ICD-10-CM | POA: Diagnosis present

## 2023-09-19 DIAGNOSIS — I5082 Biventricular heart failure: Secondary | ICD-10-CM | POA: Diagnosis present

## 2023-09-19 DIAGNOSIS — I480 Paroxysmal atrial fibrillation: Secondary | ICD-10-CM | POA: Diagnosis present

## 2023-09-19 DIAGNOSIS — I110 Hypertensive heart disease with heart failure: Secondary | ICD-10-CM | POA: Diagnosis present

## 2023-09-19 DIAGNOSIS — I468 Cardiac arrest due to other underlying condition: Secondary | ICD-10-CM | POA: Diagnosis present

## 2023-09-19 DIAGNOSIS — N179 Acute kidney failure, unspecified: Secondary | ICD-10-CM | POA: Diagnosis present

## 2023-09-19 DIAGNOSIS — E875 Hyperkalemia: Secondary | ICD-10-CM | POA: Diagnosis present

## 2023-09-19 DIAGNOSIS — E872 Acidosis, unspecified: Secondary | ICD-10-CM | POA: Diagnosis present

## 2023-09-19 DIAGNOSIS — R748 Abnormal levels of other serum enzymes: Secondary | ICD-10-CM | POA: Insufficient documentation

## 2023-09-19 DIAGNOSIS — R570 Cardiogenic shock: Secondary | ICD-10-CM | POA: Diagnosis present

## 2023-09-19 DIAGNOSIS — I469 Cardiac arrest, cause unspecified: Secondary | ICD-10-CM

## 2023-09-19 DIAGNOSIS — J690 Pneumonitis due to inhalation of food and vomit: Principal | ICD-10-CM | POA: Diagnosis present

## 2023-09-19 DIAGNOSIS — F1090 Alcohol use, unspecified, uncomplicated: Secondary | ICD-10-CM | POA: Insufficient documentation

## 2023-09-19 DIAGNOSIS — Z7982 Long term (current) use of aspirin: Secondary | ICD-10-CM

## 2023-09-19 DIAGNOSIS — E785 Hyperlipidemia, unspecified: Secondary | ICD-10-CM | POA: Diagnosis present

## 2023-09-19 DIAGNOSIS — R4020 Unspecified coma: Secondary | ICD-10-CM | POA: Diagnosis present

## 2023-09-19 DIAGNOSIS — J189 Pneumonia, unspecified organism: Secondary | ICD-10-CM | POA: Diagnosis present

## 2023-09-19 DIAGNOSIS — R569 Unspecified convulsions: Secondary | ICD-10-CM | POA: Diagnosis not present

## 2023-09-19 LAB — CBC
HCT: 49.9 % (ref 39.0–52.0)
HCT: 43.1 % (ref 39.0–52.0)
Hemoglobin: 14.5 g/dL (ref 13.0–17.0)
Hemoglobin: 13.2 g/dL (ref 13.0–17.0)
MCH: 29.8 pg (ref 26.0–34.0)
MCH: 30.3 pg (ref 26.0–34.0)
MCHC: 29.1 g/dL — ABNORMAL LOW (ref 30.0–36.0)
MCHC: 30.6 g/dL (ref 30.0–36.0)
MCV: 102.5 fL — ABNORMAL HIGH (ref 80.0–100.0)
MCV: 98.9 fL (ref 80.0–100.0)
Platelets: 276 10*3/uL (ref 150–400)
Platelets: 199 10*3/uL (ref 150–400)
RBC: 4.87 MIL/uL (ref 4.22–5.81)
RBC: 4.36 MIL/uL (ref 4.22–5.81)
RDW: 16.8 % — ABNORMAL HIGH (ref 11.5–15.5)
RDW: 16.5 % — ABNORMAL HIGH (ref 11.5–15.5)
WBC: 23.2 10*3/uL — ABNORMAL HIGH (ref 4.0–10.5)
WBC: 12.6 10*3/uL — ABNORMAL HIGH (ref 4.0–10.5)
nRBC: 0.1 % (ref 0.0–0.2)
nRBC: 0.5 % — ABNORMAL HIGH (ref 0.0–0.2)

## 2023-09-19 LAB — I-STAT VENOUS BLOOD GAS, ED
Acid-base deficit: 22 mmol/L — ABNORMAL HIGH (ref 0.0–2.0)
Bicarbonate: 10.2 mmol/L — ABNORMAL LOW (ref 20.0–28.0)
Calcium, Ion: 1.02 mmol/L — ABNORMAL LOW (ref 1.15–1.40)
HCT: 47 % (ref 39.0–52.0)
Hemoglobin: 16 g/dL (ref 13.0–17.0)
O2 Saturation: 39 %
Potassium: 5.5 mmol/L — ABNORMAL HIGH (ref 3.5–5.1)
Sodium: 140 mmol/L (ref 135–145)
TCO2: 12 mmol/L — ABNORMAL LOW (ref 22–32)
pCO2, Ven: 47.7 mmHg (ref 44–60)
pH, Ven: 6.938 — CL (ref 7.25–7.43)
pO2, Ven: 36 mmHg (ref 32–45)

## 2023-09-19 LAB — BASIC METABOLIC PANEL WITH GFR
Anion gap: 33 — ABNORMAL HIGH (ref 5–15)
BUN: 26 mg/dL — ABNORMAL HIGH (ref 6–20)
CO2: 17 mmol/L — ABNORMAL LOW (ref 22–32)
Calcium: 9.2 mg/dL (ref 8.9–10.3)
Chloride: 97 mmol/L — ABNORMAL LOW (ref 98–111)
Creatinine, Ser: 3.18 mg/dL — ABNORMAL HIGH (ref 0.61–1.24)
GFR, Estimated: 22 mL/min — ABNORMAL LOW (ref >60)
Glucose, Bld: 107 mg/dL — ABNORMAL HIGH (ref 70–99)
Potassium: 4.8 mmol/L (ref 3.5–5.1)
Sodium: 147 mmol/L — ABNORMAL HIGH (ref 135–145)

## 2023-09-19 LAB — I-STAT CHEM 8, ED
BUN: 31 mg/dL — ABNORMAL HIGH (ref 6–20)
Calcium, Ion: 1.03 mmol/L — ABNORMAL LOW (ref 1.15–1.40)
Chloride: 107 mmol/L (ref 98–111)
Creatinine, Ser: 2.9 mg/dL — ABNORMAL HIGH (ref 0.61–1.24)
Glucose, Bld: 41 mg/dL — CL (ref 70–99)
HCT: 48 % (ref 39.0–52.0)
Hemoglobin: 16.3 g/dL (ref 13.0–17.0)
Potassium: 5.6 mmol/L — ABNORMAL HIGH (ref 3.5–5.1)
Sodium: 141 mmol/L (ref 135–145)
TCO2: 12 mmol/L — ABNORMAL LOW (ref 22–32)

## 2023-09-19 LAB — CBG MONITORING, ED
Glucose-Capillary: 133 mg/dL — ABNORMAL HIGH (ref 70–99)
Glucose-Capillary: 117 mg/dL — ABNORMAL HIGH (ref 70–99)

## 2023-09-19 LAB — PROCALCITONIN: Procalcitonin: 0.29 ng/mL

## 2023-09-19 LAB — COMPREHENSIVE METABOLIC PANEL WITH GFR
ALT: 581 U/L — ABNORMAL HIGH (ref 0–44)
AST: 962 U/L — ABNORMAL HIGH (ref 15–41)
Albumin: 3.4 g/dL — ABNORMAL LOW (ref 3.5–5.0)
Alkaline Phosphatase: 69 U/L (ref 38–126)
Anion gap: 32 — ABNORMAL HIGH (ref 5–15)
BUN: 26 mg/dL — ABNORMAL HIGH (ref 6–20)
CO2: 9 mmol/L — ABNORMAL LOW (ref 22–32)
Calcium: 9.6 mg/dL (ref 8.9–10.3)
Chloride: 99 mmol/L (ref 98–111)
Creatinine, Ser: 2.85 mg/dL — ABNORMAL HIGH (ref 0.61–1.24)
GFR, Estimated: 25 mL/min — ABNORMAL LOW (ref >60)
Glucose, Bld: 47 mg/dL — ABNORMAL LOW (ref 70–99)
Potassium: 5.9 mmol/L — ABNORMAL HIGH (ref 3.5–5.1)
Sodium: 140 mmol/L (ref 135–145)
Total Bilirubin: 3.8 mg/dL — ABNORMAL HIGH (ref 0.0–1.2)
Total Protein: 7.1 g/dL (ref 6.5–8.1)

## 2023-09-19 LAB — POCT I-STAT 7, (LYTES, BLD GAS, ICA,H+H)
Acid-base deficit: 11 mmol/L — ABNORMAL HIGH (ref 0.0–2.0)
Bicarbonate: 20.1 mmol/L (ref 20.0–28.0)
Calcium, Ion: 1.04 mmol/L — ABNORMAL LOW (ref 1.15–1.40)
HCT: 39 % (ref 39.0–52.0)
Hemoglobin: 13.3 g/dL (ref 13.0–17.0)
O2 Saturation: 88 %
Patient temperature: 35.6
Potassium: 5.9 mmol/L — ABNORMAL HIGH (ref 3.5–5.1)
Sodium: 143 mmol/L (ref 135–145)
TCO2: 22 mmol/L (ref 22–32)
pCO2 arterial: 64.8 mmHg — ABNORMAL HIGH (ref 32–48)
pH, Arterial: 7.092 — CL (ref 7.35–7.45)
pO2, Arterial: 70 mmHg — ABNORMAL LOW (ref 83–108)

## 2023-09-19 LAB — MRSA NEXT GEN BY PCR, NASAL: MRSA by PCR Next Gen: NOT DETECTED

## 2023-09-19 LAB — HIV ANTIBODY (ROUTINE TESTING W REFLEX): HIV Screen 4th Generation wRfx: NONREACTIVE

## 2023-09-19 LAB — ECHOCARDIOGRAM COMPLETE
AR max vel: 3.77 cm2
AV Peak grad: 2.1 mmHg
Ao pk vel: 0.72 m/s
Area-P 1/2: 3.81 cm2
Calc EF: 31.5 %
Height: 69 in
Single Plane A2C EF: 23.1 %
Single Plane A4C EF: 38.2 %

## 2023-09-19 LAB — HEMOGLOBIN A1C
Hgb A1c MFr Bld: 6.2 % — ABNORMAL HIGH (ref 4.8–5.6)
Mean Plasma Glucose: 131.24 mg/dL

## 2023-09-19 LAB — LACTIC ACID, PLASMA: Lactic Acid, Venous: >9 mmol/L (ref 0.5–1.9)

## 2023-09-19 LAB — TROPONIN I (HIGH SENSITIVITY)
Troponin I (High Sensitivity): 113 ng/L (ref <18)
Troponin I (High Sensitivity): 203 ng/L (ref <18)

## 2023-09-19 LAB — LIPID PANEL
Cholesterol: 79 mg/dL (ref 0–200)
HDL: 12 mg/dL — ABNORMAL LOW (ref >40)
LDL Cholesterol: 44 mg/dL (ref 0–99)
Total CHOL/HDL Ratio: 6.6 ratio
Triglycerides: 113 mg/dL (ref <150)
VLDL: 23 mg/dL (ref 0–40)

## 2023-09-19 LAB — GLUCOSE, CAPILLARY: Glucose-Capillary: 99 mg/dL (ref 70–99)

## 2023-09-19 MED ORDER — PANTOPRAZOLE SODIUM 40 MG PO TBEC: 40.0000 mg | DELAYED_RELEASE_TABLET | Freq: Every day | ORAL | Status: DC

## 2023-09-19 MED ORDER — CALCIUM CHLORIDE 10 % IV SOLN
INTRAVENOUS | Status: AC | PRN
  Administered 2023-09-19: 1 g via INTRAVENOUS

## 2023-09-19 MED ORDER — CHLORHEXIDINE GLUCONATE CLOTH 2 % EX PADS
6.0000 | MEDICATED_PAD | Freq: Every day | CUTANEOUS | Status: DC
  Administered 2023-09-19: 6 via TOPICAL

## 2023-09-19 MED ORDER — EPINEPHRINE HCL 5 MG/250ML IV SOLN IN NS
0.5000 ug/min | INTRAVENOUS | Status: DC
  Administered 2023-09-19: 10 ug/min via INTRAVENOUS
  Administered 2023-09-19: 30 ug/min via INTRAVENOUS
  Filled 2023-09-19: qty 250

## 2023-09-19 MED ORDER — BUSPIRONE HCL 15 MG PO TABS: 30.0000 mg | ORAL_TABLET | Freq: Three times a day (TID) | ORAL | Status: DC | PRN

## 2023-09-19 MED ORDER — SODIUM BICARBONATE 8.4 % IV SOLN
100.0000 meq | Freq: Once | INTRAVENOUS | Status: AC
  Administered 2023-09-19: 100 meq via INTRAVENOUS
  Filled 2023-09-19: qty 100

## 2023-09-19 MED ORDER — SODIUM CHLORIDE 0.9 % IV SOLN: 2.0000 g | Freq: Two times a day (BID) | INTRAVENOUS | Status: DC

## 2023-09-19 MED ORDER — SODIUM BICARBONATE 8.4 % IV SOLN
INTRAVENOUS | Status: DC
  Filled 2023-09-19: qty 150
  Filled 2023-09-19: qty 1000

## 2023-09-19 MED ORDER — MAGNESIUM SULFATE 2 GM/50ML IV SOLN: 2.0000 g | Freq: Once | INTRAVENOUS | Status: DC | PRN

## 2023-09-19 MED ORDER — SODIUM CHLORIDE 0.9 % IV SOLN
INTRAVENOUS | Status: AC | PRN
  Administered 2023-09-19: 1000 mL via INTRAVENOUS

## 2023-09-19 MED ORDER — SODIUM BICARBONATE 8.4 % IV SOLN
INTRAVENOUS | Status: AC
  Filled 2023-09-19: qty 150

## 2023-09-19 MED ORDER — PERFLUTREN LIPID MICROSPHERE
1.0000 mL | INTRAVENOUS | Status: AC | PRN
  Administered 2023-09-19: 10 mL via INTRAVENOUS

## 2023-09-19 MED ORDER — SODIUM BICARBONATE 8.4 % IV SOLN: 50.0000 meq | Freq: Once | INTRAVENOUS | Status: DC

## 2023-09-19 MED ORDER — SODIUM BICARBONATE 8.4 % IV SOLN
INTRAVENOUS | Status: AC
  Filled 2023-09-19: qty 50

## 2023-09-19 MED ORDER — POLYETHYLENE GLYCOL 3350 17 G PO PACK: 17.0000 g | PACK | Freq: Every day | ORAL | Status: DC | PRN

## 2023-09-19 MED ORDER — VANCOMYCIN HCL 2000 MG/400ML IV SOLN
2000.0000 mg | Freq: Once | INTRAVENOUS | Status: AC
  Administered 2023-09-19: 2000 mg via INTRAVENOUS
  Filled 2023-09-19: qty 400

## 2023-09-19 MED ORDER — VANCOMYCIN VARIABLE DOSE PER UNSTABLE RENAL FUNCTION (PHARMACIST DOSING): Status: DC

## 2023-09-19 MED ORDER — POLYETHYLENE GLYCOL 3350 17 G PO PACK: 17.0000 g | PACK | Freq: Every day | ORAL | Status: DC

## 2023-09-19 MED ORDER — EPINEPHRINE 1 MG/10ML IJ SOSY
PREFILLED_SYRINGE | INTRAMUSCULAR | Status: AC | PRN
  Administered 2023-09-19 (×2): 1 mg via INTRAVENOUS

## 2023-09-19 MED ORDER — NALOXONE HCL 2 MG/2ML IJ SOSY
PREFILLED_SYRINGE | INTRAMUSCULAR | Status: AC | PRN
  Administered 2023-09-19: 2 mg via INTRAVENOUS

## 2023-09-19 MED ORDER — ACETAMINOPHEN 160 MG/5ML PO SOLN: 650.0000 mg | ORAL | Status: DC

## 2023-09-19 MED ORDER — ACETAMINOPHEN 325 MG PO TABS: 650.0000 mg | ORAL_TABLET | ORAL | Status: DC | PRN

## 2023-09-19 MED ORDER — DEXTROSE 50 % IV SOLN
INTRAVENOUS | Status: AC | PRN
  Administered 2023-09-19: 1 via INTRAVENOUS

## 2023-09-19 MED ORDER — CALCIUM GLUCONATE-NACL 2-0.675 GM/100ML-% IV SOLN
2.0000 g | Freq: Once | INTRAVENOUS | Status: AC
  Administered 2023-09-19: 2000 mg via INTRAVENOUS
  Filled 2023-09-19: qty 100

## 2023-09-19 MED ORDER — NOREPINEPHRINE 16 MG/250ML-% IV SOLN
0.0000 ug/min | INTRAVENOUS | Status: DC
  Administered 2023-09-19: 40 ug/min via INTRAVENOUS
  Filled 2023-09-19: qty 250

## 2023-09-19 MED ORDER — INSULIN ASPART 100 UNIT/ML IV SOLN
5.0000 [IU] | Freq: Once | INTRAVENOUS | Status: AC
  Administered 2023-09-19: 5 [IU] via INTRAVENOUS

## 2023-09-19 MED ORDER — EPINEPHRINE 1 MG/10ML IJ SOSY
PREFILLED_SYRINGE | INTRAMUSCULAR | Status: AC | PRN
  Administered 2023-09-19: 1 mg via INTRAVENOUS

## 2023-09-19 MED ORDER — NOREPINEPHRINE 4 MG/250ML-% IV SOLN
0.0000 ug/min | INTRAVENOUS | Status: DC
  Administered 2023-09-19: 40 ug/min via INTRAVENOUS
  Administered 2023-09-19: 5 ug/min via INTRAVENOUS
  Filled 2023-09-19: qty 250

## 2023-09-19 MED ORDER — ACETAMINOPHEN 650 MG RE SUPP: 650.0000 mg | RECTAL | Status: DC

## 2023-09-19 MED ORDER — DOCUSATE SODIUM 50 MG/5ML PO LIQD: 100.0000 mg | Freq: Two times a day (BID) | ORAL | Status: DC | PRN

## 2023-09-19 MED ORDER — SODIUM BICARBONATE 8.4 % IV SOLN
INTRAVENOUS | Status: AC | PRN
  Administered 2023-09-19: 25 meq via INTRAVENOUS

## 2023-09-19 MED ORDER — SODIUM BICARBONATE 8.4 % IV SOLN
50.0000 meq | Freq: Once | INTRAVENOUS | Status: AC
  Administered 2023-09-19: 50 meq via INTRAVENOUS

## 2023-09-19 MED ORDER — EPINEPHRINE 1 MG/10ML IJ SOSY
PREFILLED_SYRINGE | INTRAMUSCULAR | Status: AC
  Filled 2023-09-19: qty 10

## 2023-09-19 MED ORDER — DEXTROSE 50 % IV SOLN
1.0000 | Freq: Once | INTRAVENOUS | Status: AC
  Administered 2023-09-19: 50 mL via INTRAVENOUS
  Filled 2023-09-19: qty 50

## 2023-09-19 MED ORDER — ACETAMINOPHEN 160 MG/5ML PO SOLN: 650.0000 mg | ORAL | Status: DC | PRN

## 2023-09-19 MED ORDER — SODIUM CHLORIDE 0.9 % IV SOLN
2.0000 g | Freq: Once | INTRAVENOUS | Status: AC
  Administered 2023-09-19: 2 g via INTRAVENOUS
  Filled 2023-09-19: qty 12.5

## 2023-09-19 MED ORDER — ACETAMINOPHEN 325 MG PO TABS: 650.0000 mg | ORAL_TABLET | ORAL | Status: DC

## 2023-09-19 MED ORDER — ACETAMINOPHEN 650 MG RE SUPP: 650.0000 mg | RECTAL | Status: DC | PRN

## 2023-09-19 MED ORDER — IOHEXOL 350 MG/ML SOLN
75.0000 mL | Freq: Once | INTRAVENOUS | Status: AC | PRN
  Administered 2023-09-19: 75 mL via INTRAVENOUS

## 2023-09-19 MED ORDER — ONDANSETRON HCL 4 MG/2ML IJ SOLN: 4.0000 mg | Freq: Four times a day (QID) | INTRAMUSCULAR | Status: DC | PRN

## 2023-09-20 DIAGNOSIS — R569 Unspecified convulsions: Secondary | ICD-10-CM

## 2023-09-20 LAB — GLUCOSE, CAPILLARY: Glucose-Capillary: 63 mg/dL — ABNORMAL LOW (ref 70–99)

## 2023-09-20 NOTE — Discharge Summary (Signed)
DEATH SUMMARY   Patient Details  Name: Clarence Pierce MRN: [id number] DOB: 17-Aug-1970  Admission/Discharge Information   Admit Date:  10/07/23  Date of Death: Date of Death: October 07, 2023  Time of Death: Time of Death: 1849  Length of Stay: 1  Referring Physician: [facility]   Reason(s) for Hospitalization  Cardiac arrest   Diagnoses  Preliminary cause of death: ischemic cardiomyopathy Secondary Diagnoses (including complications and co-morbidities):  Principal Problem:   Cardiac arrest [facility]) Active Problems:   Paroxysmal atrial fibrillation (HCC)   Heart failure with reduced ejection fraction (HCC)   Hyperlipidemia   AKI (acute kidney injury) (HCC)   Aspiration pneumonia (HCC)   Alcohol use disorder   Elevated liver enzymes   Brief Hospital Course (including significant findings, care, treatment, and services provided and events leading to death)  Clarence Pierce is a 53 y.o. year old male who presented in cardiac arrest. Developed sudden shortness of breath on the morning of presentation. He became unresponsive on the car ride to the hospital and was in PEA on arrival. ROSC was achieved after for an estimated down time of 20 min. He remained comatose post ROSC and was intubated without sedation. He progressed into severe shock requiring maximal doses of vasopressors and epinephrine . CTA showed multifocal pneumonia consistent with aspiration but now PE. Echo showed severe biventricular failure.   The patient BP continued to drop despite maximal hemodynamic support and repeated doses of bicarbonate. He arrested once and recovered after 3 min of CPR, but began to deteriorate again and at that point, with the wife at the bedside, it was decided not to perform CPR again.   Pertinent Labs and Studies  Significant Diagnostic Studies EEG adult Result Date: 10/03/2023 Arleene Lack, MD     09/09/2023  9:30 AM Patient Name: Clarence Pierce MRN: [id number]  Epilepsy Attending: Arleene Lack Referring Physician/Provider: Jackolyn Masker, MD Date: 10/07/2023 Duration: 34.11 mins Patient history: 53yo M s/p cardiac arrest. EEG to evaluate for seizure. Level of alertness: comatose AEDs during EEG study: None Technical aspects: This EEG study was done with scalp electrodes positioned according to the 10-20 International system of electrode placement. Electrical activity was reviewed with band pass filter of 1-70Hz , sensitivity of 7 uV/mm, display speed of 57mm/sec with a 60Hz  notched filter applied as appropriate. EEG data were recorded continuously and digitally stored.  Video monitoring was available and reviewed as appropriate. Description: EEG showed continuous generalized background suppression, not reactive to stimulation. Hyperventilation and photic stimulation were not performed.   ABNORMALITY - Background suppression, generalized IMPRESSION: This study is suggestive of profound diffuse encephalopathy. No seizures or epileptiform discharges were seen throughout the recording. Arleene Lack   ECHOCARDIOGRAM COMPLETE Result Date: 10/07/2023    ECHOCARDIOGRAM REPORT   Patient Name:   Clarence Pierce Date of Exam: 2023-10-07 Medical Rec #:  [id number]        Height:       69.0 in Accession #:    [id number]       Weight:       210.1 lb Date of Birth:  [birth year]        BSA:          2.110 m Patient Age:    53 years         BP:           83/71 mmHg Patient Gender: M  HR:           95 bpm. Exam Location:  Inpatient Procedure: 2D Echo, Cardiac Doppler, Color Doppler and Intracardiac            Opacification Agent (Both Spectral and Color Flow Doppler were            utilized during procedure). Indications:    Cardiac arrest I46.9  History:        Patient has prior history of Echocardiogram examinations, most                 recent 05/14/2019. Arrythmias:Atrial Fibrillation.  Sonographer:    Terrilee Few RCS Referring Phys: Jodene Munro HARRIS IMPRESSIONS   1. Left ventricular ejection fraction, by estimation, is 20 to 25%. The left ventricle has severely decreased function. The left ventricle demonstrates global hypokinesis. There is moderate concentric left ventricular hypertrophy. Left ventricular diastolic parameters are indeterminate. There is the interventricular septum is flattened in systole and diastole, consistent with right ventricular pressure and volume overload.  2. Right ventricular systolic function is severely reduced. The right ventricular size is severely enlarged.  3. A small pericardial effusion is present. The pericardial effusion is localized near the right atrium.  4. The mitral valve is normal in structure. Trivial mitral valve regurgitation. No evidence of mitral stenosis.  5. Tricuspid valve regurgitation is moderate to severe.  6. The aortic valve is tricuspid. There is mild calcification of the aortic valve. Aortic valve regurgitation is not visualized. No aortic stenosis is present.  7. The pulmonic valve was abnormal.  8. The inferior vena cava is dilated in size with <50% respiratory variability, suggesting right atrial pressure of 15 mmHg. Comparison(s): Prior images unable to be directly viewed, comparison made by report only. Changes from prior study are noted. Echo 05/23/23 from [facility]: EF 55-60%, moderate LVH, no significant valve disease. FINDINGS  Left Ventricle: Left ventricular ejection fraction, by estimation, is 20 to 25%. The left ventricle has severely decreased function. The left ventricle demonstrates global hypokinesis. Definity  contrast agent was given IV to delineate the left ventricular endocardial borders. The left ventricular internal cavity size was normal in size. There is moderate concentric left ventricular hypertrophy. The interventricular septum is flattened in systole and diastole, consistent with right ventricular pressure and volume overload. Left ventricular diastolic parameters are indeterminate. Right  Ventricle: The right ventricular size is severely enlarged. Right vetricular wall thickness was not well visualized. Right ventricular systolic function is severely reduced. Left Atrium: Left atrial size was normal in size. Right Atrium: Right atrial size was normal in size. Pericardium: A small pericardial effusion is present. The pericardial effusion is localized near the right atrium. Mitral Valve: The mitral valve is normal in structure. Trivial mitral valve regurgitation. No evidence of mitral valve stenosis. Tricuspid Valve: The tricuspid valve is normal in structure. Tricuspid valve regurgitation is moderate to severe. No evidence of tricuspid stenosis. Aortic Valve: The aortic valve is tricuspid. There is mild calcification of the aortic valve. Aortic valve regurgitation is not visualized. No aortic stenosis is present. Aortic valve peak gradient measures 2.1 mmHg. Pulmonic Valve: The pulmonic valve was abnormal. Pulmonic valve regurgitation is trivial. No evidence of pulmonic stenosis. Aorta: The aortic root and ascending aorta are structurally normal, with no evidence of dilitation. Venous: The inferior vena cava is dilated in size with less than 50% respiratory variability, suggesting right atrial pressure of 15 mmHg. IAS/Shunts: The atrial septum is grossly normal. Additional Comments: There is a small  pleural effusion in the left lateral region.  LEFT VENTRICLE PLAX 2D LV PW:         1.40 cm     Diastology LVOT diam:     2.20 cm     LV e' medial:    6.74 cm/s LV SV:         38          LV E/e' medial:  9.7 LV SV Index:   18          LV e' lateral:   8.05 cm/s LVOT Area:     3.80 cm    LV E/e' lateral: 8.1  LV Volumes (MOD) LV vol d, MOD A2C: 87.5 ml LV vol d, MOD A4C: 90.5 ml LV vol s, MOD A2C: 67.3 ml LV vol s, MOD A4C: 55.9 ml LV SV MOD A2C:     20.2 ml LV SV MOD A4C:     90.5 ml LV SV MOD BP:      28.3 ml RIGHT VENTRICLE            IVC RV S prime:     6.24 cm/s  IVC diam: 2.40 cm TAPSE (M-mode): 1.2  cm LEFT ATRIUM             Index        RIGHT ATRIUM           Index LA Vol (A2C):   45.1 ml 21.38 ml/m  RA Area:     17.50 cm LA Vol (A4C):   42.9 ml 20.33 ml/m  RA Volume:   53.20 ml  25.22 ml/m LA Biplane Vol: 46.4 ml 21.99 ml/m  AORTIC VALVE AV Area (Vmax): 3.77 cm AV Vmax:        72.40 cm/s AV Peak Grad:   2.1 mmHg LVOT Vmax:      71.80 cm/s LVOT Vmean:     49.300 cm/s LVOT VTI:       0.101 m  AORTA Ao Root diam: 3.50 cm Ao Asc diam:  3.20 cm MITRAL VALVE MV Area (PHT): 3.81 cm    SHUNTS MV Decel Time: 199 msec    Systemic VTI:  0.10 m MV E velocity: 65.50 cm/s  Systemic Diam: 2.20 cm Sheryle Donning MD Electronically signed by Sheryle Donning MD Signature Date/Time: 09/14/2023/9:10:28 PM    Final    DG Abd 1 View Result Date: 09/22/2023 CLINICAL DATA:  NG placement. EXAM: ABDOMEN - 1 VIEW COMPARISON:  None Available. FINDINGS: Enteric tube with tip in the body of the stomach. Moderate stool in the visualized colon. IMPRESSION: Enteric tube with tip in the body of the stomach. Electronically Signed   By: Angus Bark M.D.   On: 09/25/2023 17:00   DG Chest Portable 1 View Result Date: 09/10/2023 CLINICAL DATA:  Shortness of breath.  Catheter placement. EXAM: PORTABLE CHEST 1 VIEW COMPARISON:  Same day. FINDINGS: Interval placement of left subclavian catheter with distal tip in expected position of the SVC. No pneumothorax is noted. Endotracheal tube is in good position. Bilateral lung opacities are again noted. IMPRESSION: Interval placement of left subclavian catheter with distal tip in expected position of the SVC. Electronically Signed   By: Rosalene Colon M.D.   On: 09/23/2023 15:57   CT Angio Chest PE W/Cm &/Or Wo Cm Result Date: 09/15/2023 CLINICAL DATA:  Pulmonary embolism (PE) suspected, high prob EXAM: CT ANGIOGRAPHY CHEST WITH CONTRAST TECHNIQUE: Multidetector CT imaging of the chest was performed using the standard  protocol during bolus administration of intravenous  contrast. Multiplanar CT image reconstructions and MIPs were obtained to evaluate the vascular anatomy. RADIATION DOSE REDUCTION: This exam was performed according to the departmental dose-optimization program which includes automated exposure control, adjustment of the mA and/or kV according to patient size and/or use of iterative reconstruction technique. CONTRAST:  75mL OMNIPAQUE  IOHEXOL  350 MG/ML SOLN COMPARISON:  September 19, 2023 chest radiograph FINDINGS: Pulmonary Embolism: No pulmonary embolism. Cardiovascular: No cardiomegaly or pericardial effusion. No aortic aneurysm. Aortic atherosclerosis. Mediastinum/Nodes: No mediastinal mass. Multistation, mildly enlarged mediastinal lymph nodes measuring up to 1.3 cm in the right paratracheal region. Mildly enlarged bilateral hilar lymph nodes measuring up to 9 mm on the right. No axillary lymphadenopathy. These are likely reactive. Lungs/Pleura: Endotracheal tube is well positioned terminating in the mid trachea. Layering debris or secretion in the trachea and mainstem bronchi bilaterally. Mild diffuse bronchial wall thickening. Hazy, nodular ground-glass airspace opacities with multifocal consolidation scattered throughout the lungs. No pneumothorax. Small right pleural effusion with trace left pleural effusion. Musculoskeletal: No acute fracture or destructive bone lesion. Multilevel degenerative disc disease of the spine. Small volume symmetric bilateral gynecomastia. Upper Abdomen: Reflux of contrast into the hepatic veins. Review of the MIP images confirms the above findings. IMPRESSION: 1. No pulmonary embolism. 2. Diffuse airspace disease throughout both lungs, which may represent either pulmonary edema or multifocal pneumonia. Small right and trace left pleural effusions. 3. Reflux of contrast into the hepatic veins, suggesting underlying cardiac dysfunction. 4. Mildly enlarged mediastinal and bilateral hilar lymph nodes, likely reactive. Aortic  Atherosclerosis (ICD10-I70.0). Electronically Signed   By: Rance Burrows M.D.   On: 10/01/2023 15:22   CT Head Wo Contrast Result Date: 09/29/2023 CLINICAL DATA:  Headache, increasing frequency or severity EXAM: CT HEAD WITHOUT CONTRAST TECHNIQUE: Contiguous axial images were obtained from the base of the skull through the vertex without intravenous contrast. RADIATION DOSE REDUCTION: This exam was performed according to the departmental dose-optimization program which includes automated exposure control, adjustment of the mA and/or kV according to patient size and/or use of iterative reconstruction technique. COMPARISON:  None Available. FINDINGS: Brain: The ventricles appear age appropriate. No mass effect or midline shift. Gray-white differentiation is preserved without focal attenuation abnormality.No evidence of acute territorial infarction, extra-axial fluid collection, hemorrhage, or mass lesion. The basilar cisterns are patent without downward herniation. The cerebellar hemispheres and vermis are well formed without mass lesion or focal attenuation abnormality. Vascular: No hyperdense vessel. Calcified atherosclerotic plaque within the cavernous/supraclinoid internal carotid arteries. Skull: Normal. Negative for fracture or focal lesion. Sinuses/Orbits: Layering secretions in the nasopharynx and the left maxillary sinus. A few ethmoid air cells are also opacified. No mastoid effusion.The globes appear intact. No retrobulbar hematoma. Other: Partially visualized endotracheal tube. IMPRESSION: No acute intracranial abnormality, specifically, no acute hemorrhage, territorial infarction, or intracranial mass. Electronically Signed   By: Rance Burrows M.D.   On: 09/14/2023 15:10   DG Chest Port 1 View Result Date: 10/06/2023 CLINICAL DATA:  Chest pain. EXAM: PORTABLE CHEST 1 VIEW COMPARISON:  Chest radiograph dated 06/27/2021. FINDINGS: Endotracheal tube approximately 6 cm above the carina. Diffuse  bilateral pulmonary opacity may represent edema, ARDS, pneumonia, or combination. No pleural effusion or pneumothorax. Mild cardiomegaly. No acute osseous pathology. IMPRESSION: 1. Endotracheal tube approximately 6 cm above the carina. 2. Diffuse bilateral pulmonary opacity. Electronically Signed   By: Angus Bark M.D.   On: 10/01/2023 14:38    Microbiology Recent Results (from the past 240 hours)  MRSA Next Gen by PCR, Nasal     Status: None   Collection Time: 09/12/2023  2:52 PM   Specimen: Nasal Mucosa; Nasal Swab  Result Value Ref Range Status   MRSA by PCR Next Gen NOT DETECTED NOT DETECTED Final    Comment: (NOTE) The GeneXpert MRSA Assay (FDA approved for NASAL specimens only), is one component of a comprehensive MRSA colonization surveillance program. It is not intended to diagnose MRSA infection nor to guide or monitor treatment for MRSA infections. Test performance is not FDA approved in patients less than 20 years old. Performed at [facility], [address] [place], [place] [address]   Culture, blood (Routine X 2) w Reflex to ID Panel     Status: None (Preliminary result)   Collection Time: 09/11/2023  4:47 PM   Specimen: BLOOD RIGHT HAND  Result Value Ref Range Status   Specimen Description BLOOD RIGHT HAND  Final   Special Requests   Final    BOTTLES DRAWN AEROBIC AND ANAEROBIC Blood Culture adequate volume   Culture   Final    NO GROWTH < 24 HOURS Performed at [facility], [address] [place], [place] [address]    Report Status PENDING  Incomplete    Lab Basic Metabolic Panel: Recent Labs  Lab 09/21/2023 1357 10/02/2023 1403 09/30/2023 1408 09/10/2023 1614 09/26/2023 1646  NA 140 140 141 147* 143  K 5.9* 5.5* 5.6* 4.8 5.9*  CL 99  --  107 97*  --   CO2 9*  --   --  17*  --   GLUCOSE 47*  --  41* 107*  --   BUN 26*  --  31* 26*  --   CREATININE 2.85*  --  2.90* 3.18*  --   CALCIUM  9.6  --   --  9.2  --    Liver Function Tests: Recent Labs   Lab 09/15/2023 1357  AST 962*  ALT 581*  ALKPHOS 69  BILITOT 3.8*  PROT 7.1  ALBUMIN 3.4*   No results for input(s): LIPASE, AMYLASE in the last 168 hours. No results for input(s): AMMONIA in the last 168 hours. CBC: Recent Labs  Lab 09/17/2023 1357 09/09/2023 1403 09/29/2023 1408 09/08/2023 1614 09/25/2023 1646  WBC 23.2*  --   --  12.6*  --   HGB 14.5 16.0 16.3 13.2 13.3  HCT 49.9 47.0 48.0 43.1 39.0  MCV 102.5*  --   --  98.9  --   PLT 276  --   --  199  --    Cardiac Enzymes: No results for input(s): CKTOTAL, CKMB, CKMBINDEX, TROPONINI in the last 168 hours. Sepsis Labs: Recent Labs  Lab 10/07/2023 1357 09/27/2023 1614 10/01/2023 1615  PROCALCITON  --  0.29  --   WBC 23.2* 12.6*  --   LATICACIDVEN  --   --  >9.0*    Procedures/Operations  Intubation, CPR, central line, arterial line.    Tetsuo Coppola 09/17/2023, 9:47 AM

## 2023-09-20 NOTE — Procedures (Signed)
 Patient Name: Clarence Pierce  MRN: [id number]  Epilepsy Attending: Arleene Lack  Referring Physician/Provider: Jackolyn Masker, MD  Date: 09/08/2023 Duration: 34.11 mins  Patient history: 53yo M s/p cardiac arrest. EEG to evaluate for seizure.  Level of alertness: comatose  AEDs during EEG study: None  Technical aspects: This EEG study was done with scalp electrodes positioned according to the 10-20 International system of electrode placement. Electrical activity was reviewed with band pass filter of 1-70Hz , sensitivity of 7 uV/mm, display speed of 30mm/sec with a 60Hz  notched filter applied as appropriate. EEG data were recorded continuously and digitally stored.  Video monitoring was available and reviewed as appropriate.  Description: EEG showed continuous generalized background suppression, not reactive to stimulation. Hyperventilation and photic stimulation were not performed.     ABNORMALITY - Background suppression, generalized  IMPRESSION: This study is suggestive of profound diffuse encephalopathy. No seizures or epileptiform discharges were seen throughout the recording.  Brittain Smithey O [place] Hotz

## 2023-09-24 LAB — CULTURE, BLOOD (ROUTINE X 2)
Culture: NO GROWTH
Special Requests: ADEQUATE

## 2023-10-08 NOTE — Progress Notes (Signed)
 This RN and CCM MD at the bedside discussing concerns for patient when patient noted to be increasingly hypotensive and bradycardic. Patient then went pulseless and CPR began at 1818.   1819: 1 epi, 1 Bicarb, and 1 round of compressions 1822: ROSC  CCM MD discussed plan of care with wife who remains at the bedside.

## 2023-10-08 NOTE — Progress Notes (Signed)
 Pt transported on vent to Ct and back w/o any complications.

## 2023-10-08 NOTE — ED Notes (Signed)
 Pt to Trauma C, CPR in progress, MD Charlee Conine and MD Inga Manges at bedside, no pulse, 1351- IO in L leg, ns infusion started, LUCIS placed and started 1352- EPI given 1354- PEA 1356- EPI given, PEA on monitor, no cardiac activity, EJ placed 1357- 1 Amp D50 given 1358- Calcium 1 amp given,  1359- 1 amp Bicarb given 1400- PEA, 1 amp Epi given, 2mg  Narcan given 1402- Pulse, st on monitor.

## 2023-10-08 NOTE — ED Provider Notes (Signed)
 EJ placement: L gauge IV placed in L EJ. Skin prepped with alcohol pads, L EJ identified with Valsalva. Cannulated with good return of dark, non-pulsatile blood. Tachyderm placed after easily flushed with NS.    Albertus Hughs, DO 09/19/23 1528

## 2023-10-08 NOTE — Progress Notes (Signed)
 PCCM Progress Note   Called to bedside for decompensation with impending cardiac arrest, on my arrival patients a-line was very dampened and his heart rate was dropping. Wife was at beside and updated on impending re-arrest. Decision was made not to perform any further aggressive interventions including chest compressions. Wife was updated in real time, patient then lost cardiac activity and patient was pronounced deceased at 36.   Carlisle Torgeson D. Harris, NP-C [facility] Personal contact information can be found on Amion  If no contact or response made please call [phone] 09/19/2023, 6:55 PM

## 2023-10-08 NOTE — H&P (Addendum)
 NAME:  ZIMIR KITTLESON, MRN:  [id number], DOB:  [birth year], LOS: 0 ADMISSION DATE:  09/19/2023, CONSULTATION DATE:  09/19/23 REFERRING MD:  EDP CHIEF COMPLAINT:  cardiac arrest   History of Present Illness:  Patient is a 53 year old male with pertinent medical history of atrial fibrillation, hyperlipidemia, HFrEF presents to ED after he was brought to the ED unresponsive by his wife. Per wife, pt was complaining of shortness of breath that has been progressively worsening since Monday. It was worse today so wife decided to bring him to the ED. Pt became unresponsive on the way to the hospital.   PEA noted on monitor and CPR started. ROSC obtained after around 12 minutes. But this is documented time and actual time is longer as pt became unresponsive in the car en route to the ED.  On chart review, pt presented with chest pain in 05/2023 but refused admission for further workup. He had repeat echo done in 08/2023 which showed EF of 20 %. Per wife pt was following with cardiology and had an appointment upcoming on 09/23/23.  EDP consulted ICU for admission.   Pertinent  Medical History  HFrEF (20 % in 08/2023) HTN Atrial fibrillation Hyperlipidemia  Significant Hospital Events: Including procedures, antibiotic start and stop dates in addition to other pertinent events   06/12: Brought to the ED. Pulseless. ROSC after around 12 minute of CPR Interim History / Subjective:  Intubated without any movement  Objective   Blood pressure (!) 83/71, pulse 92, resp. rate (!) 29, height 5' 9 (1.753 m), SpO2 97%.    Vent Mode: PRVC FiO2 (%):  [100 %] 100 % Set Rate:  [20 bmp-28 bmp] 28 bmp Vt Set:  [570 mL] 570 mL PEEP:  [5 cmH20] 5 cmH20  No intake or output data in the 24 hours ending 09/19/23 1638 There were no vitals filed for this visit.  Examination: General: intubated and sedated HENT: intubated, pin point pupils Lungs: vented lung sounds, diffuse rhonchi present Cardiovascular:  RRR, diminished peripheral pulse, good carotid pulses Abdomen: soft abdomen Extremities: no asymmetry Neuro: unable to assess. Not responding to sternal rub, no withdrawing to pain. Pupils pinpoint  Labs   CBC: Recent Labs  Lab 09/19/23 1357 09/19/23 1403 09/19/23 1408  WBC 23.2*  --   --   HGB 14.5 16.0 16.3  HCT 49.9 47.0 48.0  MCV 102.5*  --   --   PLT 276  --   --    Basic Metabolic Panel: Recent Labs  Lab 09/19/23 1357 09/19/23 1403 09/19/23 1408  NA 140 140 141  K 5.9* 5.5* 5.6*  CL 99  --  107  CO2 9*  --   --   GLUCOSE 47*  --  41*  BUN 26*  --  31*  CREATININE 2.85*  --  2.90*  CALCIUM 9.6  --   --    GFR: CrCl cannot be calculated (Unknown ideal weight.). Recent Labs  Lab 09/19/23 1357  WBC 23.2*   Liver Function Tests: Recent Labs  Lab 09/19/23 1357  AST 962*  ALT 581*  ALKPHOS 69  BILITOT 3.8*  PROT 7.1  ALBUMIN 3.4*   No results for input(s): LIPASE, AMYLASE in the last 168 hours. No results for input(s): AMMONIA in the last 168 hours. ABG    Component Value Date/Time   HCO3 10.2 (L) 09/19/2023 1403   TCO2 12 (L) 09/19/2023 1408   ACIDBASEDEF 22.0 (H) 09/19/2023 1403   O2SAT 39 09/19/2023 1403  Coagulation Profile: No results for input(s): INR, PROTIME in the last 168 hours. Cardiac Enzymes: No results for input(s): CKTOTAL, CKMB, CKMBINDEX, TROPONINI in the last 168 hours. HbA1C: No results found for: HGBA1C CBG: Recent Labs  Lab 09/19/23 1406 09/19/23 1456 09/19/23 1608  GLUCAP 133* 117* 99    Consults   Assessment & Plan:  Principal Problem:   Cardiac arrest [facility]) Active Problems:   Paroxysmal atrial fibrillation (HCC)   Heart failure with reduced ejection fraction (HCC)   Hyperlipidemia   AKI (acute kidney injury) (HCC)   Aspiration pneumonia (HCC)   Alcohol use disorder   Elevated liver enzymes  Out of Hospital Cardiac Arrest  AGMA Pt with PEA arrest. Achieved ROSC after 12 minutes. CTA  shows no PE but showed reflux of contrast into the hepatic veins suggesting underlying cardiac dysfunction. Likely etiology is sudden arrest 2/2 to poor baseline EF. Atheroembolic event is a possibility but no RWMA noted on bedside echo. Will trend troponin and follow up formal echo. Pt currently intubated and on mechanical ventilation. VBG showed 6.9/47/36. AGMA is likely lactic acidosis Central line and arterial line placed.  -Will continue to support with mechanical ventilation and wean as able.  -Currently pt not requiring any sedation but RASS goal is 0.  -Will start VAP bundle.  -Pt hypotensive so levophed  started, plan to titrate based on MAP goal of >65. Later pt required epinephrine as well. -Will start TTM. Plan to monitor neuro status closely and repeat MRI in 72 hours.   -Given his pressor requirement and neurologic function, he has a poor prognosis.  -Continue bicarb gtt for now -follow up UDS, lactic acid, troponins, echo. Some elevation in troponin and lactic acid expected downtime from cardiac arrest but should be down-trending.   AKI Hyperkalemia Secondary to cardiac arrest. Monitor urine output and creatinine daily. If not improving with perfusion, then work up further with urine studies and renal ultrasound. Hyperkalemia secondary to acidosis and AKI. Trend K.   Aspiration Pneumonia CXR shows bilateral patch opacities. CBC with leukocytosis. Will start Cefepime and Vancomycin. Get blood cultures. Adjust based on growth and clinical picuture. MRSA nasal swab sent. Trend CBC, monitor for fevers. Follow up procal level.  Alcohol use disorder Per wife pt drinks about fifth of liquor per day. He recently decreased his usage as he was not feeling well. Will place on CIWA to look for any withdrawal symptoms but currently given concern for anoxic brain injury, this appears unlikely.   Elevated Liver Enzymes: Multifactorial. Cardiac arrest and alcohol use. Will get liver ultrasound  once pt stabilized. Monitor daily.  Atrial fibrillation Currently in regular rhythm. Hold home eliquis . Unsure of his compliance. Start once able.   Hyperlipidemia Lipid panel ordered.   Hyperglycemia: Noted on chart review. Get A1c.  Hypertension: Chronic condition. Holding home meds as pt is hypotensive. See primary problem  Best Practice (right click and Reselect all SmartList Selections daily)  Diet/type: NPO DVT prophylaxis: LMWH GI prophylaxis: PPI Lines: Central line, arterial line Foley:  NA Continuous: levophed   Code Status:  full code Last date of multidisciplinary goals of care discussion [06/12]  Home Medications  Prior to Admission medications   Medication Sig Start Date End Date Taking? Authorizing Provider  amLODipine (NORVASC) 10 MG tablet Take 10 mg by mouth daily. 12/02/20   [provider]  apixaban  (ELIQUIS ) 5 MG TABS tablet Take 1 tablet (5 mg total) by mouth 2 (two) times daily. Patient not taking: Reported on 06/25/2021  05/07/19 06/08/19  Fenton, Clint R, PA  aspirin 325 MG tablet Take 325 mg by mouth daily. Take along with 81 mg    [provider]  aspirin EC 81 MG tablet Take 81 mg by mouth daily. Swallow whole. Take along with 325 mg    [provider]  diltiazem  (CARDIZEM  CD) 240 MG 24 hr capsule Take 1 capsule (240 mg total) by mouth daily. Patient not taking: Reported on 06/25/2021 06/08/19 06/07/20  Fenton, Clint R, PA  diltiazem  (TIAZAC ) 180 MG 24 hr capsule Take 180 mg by mouth daily. 06/21/21   [provider]    Allergies No Known Allergies

## 2023-10-08 NOTE — Progress Notes (Signed)
 Routine EEG completed, results pending Neurology review and interpretation

## 2023-10-08 NOTE — Progress Notes (Signed)
  Echocardiogram 2D Echocardiogram has been performed.  Clarence Pierce 09/19/2023, 5:55 PM

## 2023-10-08 NOTE — ED Triage Notes (Signed)
 Pt arrived via POV by wife unresponsive, pulseless.  Per wife pt c.o SOB since Monday. Worse this morning. Wife found him in the floor this morning. Got him into the car and he went unresponsive on the way to the hospital, hx of a fib.

## 2023-10-08 NOTE — Procedures (Signed)
 Arterial Catheter Insertion Procedure Note  LENG MONTESDEOCA  [id number]  [birth year]  Date:09/19/23  Time:5:02 PM    Provider Performing: Laurina Popper D. Harris    Procedure: Insertion of Arterial Line (82956) with US  guidance (21308)   Indication(s) Blood pressure monitoring and/or need for frequent ABGs  Consent Unable to obtain consent due to emergent nature of procedure.  Anesthesia None   Time Out Verified patient identification, verified procedure, site/side was marked, verified correct patient position, special equipment/implants available, medications/allergies/relevant history reviewed, required imaging and test results available.   Sterile Technique Maximal sterile technique including full sterile barrier drape, hand hygiene, sterile gown, sterile gloves, mask, hair covering, sterile ultrasound probe cover (if used).   Procedure Description Area of catheter insertion was cleaned with chlorhexidine and draped in sterile fashion. With real-time ultrasound guidance an arterial catheter was placed into the right femoral artery.  Appropriate arterial tracings confirmed on monitor.     Complications/Tolerance None; patient tolerated the procedure well.   EBL Minimal   Specimen(s) None  Jeani Fassnacht D. Harris, NP-C [facility] Personal contact information can be found on Amion  If no contact or response made please call [phone] 09/19/2023, 5:02 PM

## 2023-10-08 NOTE — Procedures (Signed)
Central Venous Catheter Insertion Procedure Note  Clarence Pierce  [id number]  [birth year]  Date:09/17/2023  Time:3:33 PM   Provider Performing:Carmeline Kowal   Procedure: Insertion of Non-tunneled Central Venous Catheter(36556) with US  guidance (21308)   Indication(s) Medication administration and Difficult access  Consent Unable to obtain consent due to emergent nature of procedure.  Anesthesia Topical only with 1% lidocaine   Timeout Verified patient identification, verified procedure, site/side was marked, verified correct patient position, special equipment/implants available, medications/allergies/relevant history reviewed, required imaging and test results available.  Sterile Technique Maximal sterile technique including full sterile barrier drape, hand hygiene, sterile gown, sterile gloves, mask, hair covering, sterile ultrasound probe cover (if used).  Procedure Description Area of catheter insertion was cleaned with chlorhexidine and draped in sterile fashion.  With real-time ultrasound guidance a central venous catheter was placed into the left subclavian vein. Nonpulsatile blood flow and easy flushing noted in all ports.  The catheter was sutured in place and sterile dressing applied.  Complications/Tolerance None; patient tolerated the procedure well. Chest X-ray is ordered to verify placement for internal jugular or subclavian cannulation.   Chest x-ray is not ordered for femoral cannulation.  EBL Minimal  Specimen(s) None  Arlina Lair, MD [facility] ICU Physician [facility]  Pager: [phone] Or Epic Secure Chat After hours: [phone].  09/22/2023, 3:34 PM      ,

## 2023-10-08 NOTE — ED Provider Notes (Signed)
[place] EMERGENCY DEPARTMENT AT [facility] Provider Note   CSN: [id number] Arrival date & time: 09/24/2023  1348     Patient presents with: Cardiac Arrest   Clarence Pierce is a 53 y.o. male.   53 year old male with past medical history of hypertension and atrial fibrillation presenting to the emergency department today as a CPR in progress.  The patient was apparently short of breath earlier today.  He apparently had a syncopal episode and his family was able to get him up into the car.  When he arrived here he was unresponsive.  CPR was initiated immediately upon arrival.  There is no other history on arrival.   Cardiac Arrest      Prior to Admission medications   Medication Sig Start Date End Date Taking? Authorizing Provider  diltiazem  (TIAZAC ) 180 MG 24 hr capsule Take 180 mg by mouth daily. 06/21/21  Yes [provider]  amLODipine (NORVASC) 10 MG tablet Take 10 mg by mouth daily. 12/02/20   [provider]  apixaban  (ELIQUIS ) 5 MG TABS tablet Take 1 tablet (5 mg total) by mouth 2 (two) times daily. 05/07/19 09/27/2023  Fenton, Clint R, PA  aspirin 325 MG tablet Take 325 mg by mouth daily. Take along with 81 mg    [provider]  aspirin EC 81 MG tablet Take 81 mg by mouth daily. Swallow whole. Take along with 325 mg    [provider]  diltiazem  (CARDIZEM  CD) 240 MG 24 hr capsule Take 1 capsule (240 mg total) by mouth daily. 06/08/19 09/17/2023  Fenton, Clint R, PA    Allergies: Patient has no known allergies.    Review of Systems  Reason unable to perform ROS: Obtunded.    Updated Vital Signs BP (!) 83/71   Resp 20   Ht 5' 9 (1.753 m)   BMI 31.03 kg/m   Physical Exam Vitals and nursing note reviewed.   Gen: Obtunded, GCS 3 Eyes: Pupils 4 mm and sluggish HEENT: no oropharyngeal swelling Neck: trachea midline Resp: Equal breath sounds with bagging Card: Pulseless Abd: Nondistended Extremities: no calf tenderness, no  edema Vascular: Pulseless Neuro: GCS 3   (all labs ordered are listed, but only abnormal results are displayed) Labs Reviewed  CBC - Abnormal; Notable for the following components:      Result Value   WBC 23.2 (*)    MCV 102.5 (*)    MCHC 29.1 (*)    RDW 16.8 (*)    All other components within normal limits  COMPREHENSIVE METABOLIC PANEL WITH GFR - Abnormal; Notable for the following components:   Potassium 5.9 (*)    CO2 9 (*)    Glucose, Bld 47 (*)    BUN 26 (*)    Creatinine, Ser 2.85 (*)    Albumin 3.4 (*)    AST 962 (*)    ALT 581 (*)    Total Bilirubin 3.8 (*)    GFR, Estimated 25 (*)    Anion gap 32 (*)    All other components within normal limits  I-STAT CHEM 8, ED - Abnormal; Notable for the following components:   Potassium 5.6 (*)    BUN 31 (*)    Creatinine, Ser 2.90 (*)    Glucose, Bld 41 (*)    Calcium, Ion 1.03 (*)    TCO2 12 (*)    All other components within normal limits  I-STAT VENOUS BLOOD GAS, ED - Abnormal; Notable for the following components:  pH, Ven 6.938 (*)    Bicarbonate 10.2 (*)    TCO2 12 (*)    Acid-base deficit 22.0 (*)    Potassium 5.5 (*)    Calcium, Ion 1.02 (*)    All other components within normal limits  CBG MONITORING, ED - Abnormal; Notable for the following components:   Glucose-Capillary 133 (*)    All other components within normal limits  CBG MONITORING, ED - Abnormal; Notable for the following components:   Glucose-Capillary 117 (*)    All other components within normal limits  MRSA NEXT GEN BY PCR, NASAL  CULTURE, BLOOD (ROUTINE X 2)  CULTURE, BLOOD (ROUTINE X 2)  BASIC METABOLIC PANEL WITH GFR  CBC  BLOOD GAS, ARTERIAL  HIV ANTIBODY (ROUTINE TESTING W REFLEX)  RAPID URINE DRUG SCREEN, HOSP PERFORMED  HEMOGLOBIN A1C  LIPID PANEL  LACTIC ACID, PLASMA  LACTIC ACID, PLASMA  PROCALCITONIN  URINALYSIS, W/ REFLEX TO CULTURE (INFECTION SUSPECTED)  TROPONIN I (HIGH SENSITIVITY)  TROPONIN I (HIGH SENSITIVITY)   TROPONIN I (HIGH SENSITIVITY)    EKG: None  Radiology: CT Angio Chest PE W/Cm &/Or Wo Cm Result Date: 09/11/2023 CLINICAL DATA:  Pulmonary embolism (PE) suspected, high prob EXAM: CT ANGIOGRAPHY CHEST WITH CONTRAST TECHNIQUE: Multidetector CT imaging of the chest was performed using the standard protocol during bolus administration of intravenous contrast. Multiplanar CT image reconstructions and MIPs were obtained to evaluate the vascular anatomy. RADIATION DOSE REDUCTION: This exam was performed according to the departmental dose-optimization program which includes automated exposure control, adjustment of the mA and/or kV according to patient size and/or use of iterative reconstruction technique. CONTRAST:  75mL OMNIPAQUE IOHEXOL 350 MG/ML SOLN COMPARISON:  September 19, 2023 chest radiograph FINDINGS: Pulmonary Embolism: No pulmonary embolism. Cardiovascular: No cardiomegaly or pericardial effusion. No aortic aneurysm. Aortic atherosclerosis. Mediastinum/Nodes: No mediastinal mass. Multistation, mildly enlarged mediastinal lymph nodes measuring up to 1.3 cm in the right paratracheal region. Mildly enlarged bilateral hilar lymph nodes measuring up to 9 mm on the right. No axillary lymphadenopathy. These are likely reactive. Lungs/Pleura: Endotracheal tube is well positioned terminating in the mid trachea. Layering debris or secretion in the trachea and mainstem bronchi bilaterally. Mild diffuse bronchial wall thickening. Hazy, nodular ground-glass airspace opacities with multifocal consolidation scattered throughout the lungs. No pneumothorax. Small right pleural effusion with trace left pleural effusion. Musculoskeletal: No acute fracture or destructive bone lesion. Multilevel degenerative disc disease of the spine. Small volume symmetric bilateral gynecomastia. Upper Abdomen: Reflux of contrast into the hepatic veins. Review of the MIP images confirms the above findings. IMPRESSION: 1. No pulmonary  embolism. 2. Diffuse airspace disease throughout both lungs, which may represent either pulmonary edema or multifocal pneumonia. Small right and trace left pleural effusions. 3. Reflux of contrast into the hepatic veins, suggesting underlying cardiac dysfunction. 4. Mildly enlarged mediastinal and bilateral hilar lymph nodes, likely reactive. Aortic Atherosclerosis (ICD10-I70.0). Electronically Signed   By: Rance Burrows M.D.   On: 09/11/2023 15:22   CT Head Wo Contrast Result Date: 10/03/2023 CLINICAL DATA:  Headache, increasing frequency or severity EXAM: CT HEAD WITHOUT CONTRAST TECHNIQUE: Contiguous axial images were obtained from the base of the skull through the vertex without intravenous contrast. RADIATION DOSE REDUCTION: This exam was performed according to the departmental dose-optimization program which includes automated exposure control, adjustment of the mA and/or kV according to patient size and/or use of iterative reconstruction technique. COMPARISON:  None Available. FINDINGS: Brain: The ventricles appear age appropriate. No mass effect or midline shift.  Gray-white differentiation is preserved without focal attenuation abnormality.No evidence of acute territorial infarction, extra-axial fluid collection, hemorrhage, or mass lesion. The basilar cisterns are patent without downward herniation. The cerebellar hemispheres and vermis are well formed without mass lesion or focal attenuation abnormality. Vascular: No hyperdense vessel. Calcified atherosclerotic plaque within the cavernous/supraclinoid internal carotid arteries. Skull: Normal. Negative for fracture or focal lesion. Sinuses/Orbits: Layering secretions in the nasopharynx and the left maxillary sinus. A few ethmoid air cells are also opacified. No mastoid effusion.The globes appear intact. No retrobulbar hematoma. Other: Partially visualized endotracheal tube. IMPRESSION: No acute intracranial abnormality, specifically, no acute  hemorrhage, territorial infarction, or intracranial mass. Electronically Signed   By: Rance Burrows M.D.   On: 09/15/2023 15:10   DG Chest Port 1 View Result Date: 09/22/2023 CLINICAL DATA:  Chest pain. EXAM: PORTABLE CHEST 1 VIEW COMPARISON:  Chest radiograph dated 06/27/2021. FINDINGS: Endotracheal tube approximately 6 cm above the carina. Diffuse bilateral pulmonary opacity may represent edema, ARDS, pneumonia, or combination. No pleural effusion or pneumothorax. Mild cardiomegaly. No acute osseous pathology. IMPRESSION: 1. Endotracheal tube approximately 6 cm above the carina. 2. Diffuse bilateral pulmonary opacity. Electronically Signed   By: Angus Bark M.D.   On: 10/05/2023 14:38     Procedure Name: Intubation Date/Time: 10/05/2023 3:33 PM  Performed by: Carin Charleston, MDPre-anesthesia Checklist: Patient identified Oxygen Delivery Method: Ambu bag Preoxygenation: Pre-oxygenation with 100% oxygen Laryngoscope Size: Mac and 4 Grade View: Grade I Tube size: 7.5 mm Number of attempts: 1 Airway Equipment and Method: Rigid stylet Placement Confirmation: ETT inserted through vocal cords under direct vision, Positive ETCO2, Breath sounds checked- equal and bilateral and CO2 detector Secured at: 25 cm Tube secured with: Tape       Medications Ordered in the ED  EPINEPHrine (ADRENALIN) 5 mg in NS 250 mL (0.02 mg/mL) premix infusion (10 mcg/min Intravenous New Bag/Given 09/10/2023 1515)  docusate (COLACE) 50 MG/5ML liquid 100 mg (has no administration in time range)  polyethylene glycol (MIRALAX / GLYCOLAX) packet 17 g (has no administration in time range)  norepinephrine  (LEVOPHED ) 4mg  in (0.016 mg/mL) premix infusion (20 mcg/min Intravenous Rate/Dose Change 09/11/2023 1515)  calcium gluconate 2 g/ 100 mL sodium chloride  IVPB (has no administration in time range)  insulin aspart (novoLOG) injection 5 Units (has no administration in time range)    And  dextrose  50 % solution 50 mL  (has no administration in time range)  sodium bicarbonate 150 mEq in dextrose  5 % 1,150 mL infusion (has no administration in time range)  sodium bicarbonate injection 100 mEq (has no administration in time range)  Chlorhexidine Gluconate Cloth 2 % PADS 6 each (has no administration in time range)  EPINEPHrine (ADRENALIN) 1 MG/10ML injection (1 mg Intravenous Given 09/15/2023 1400)  EPINEPHrine (ADRENALIN) 1 MG/10ML injection (1 mg Intravenous Given 10/05/2023 1356)  0.9 %  sodium chloride  infusion (0 mLs Intravenous Stopped 09/29/2023 1513)  dextrose  50 % solution (1 ampule Intravenous Given 09/09/2023 1357)  calcium chloride injection (1 g Intravenous Given 10/04/2023 1358)  sodium bicarbonate injection (25 mEq Intravenous Given 09/08/2023 1359)  naloxone (NARCAN) injection (2 mg Intravenous Given 09/08/2023 1400)  iohexol (OMNIPAQUE) 350 MG/ML injection 75 mL (75 mLs Intravenous Contrast Given 09/22/2023 1446)                                    Medical Decision Making 53 year old male with past medical  history of hypertension and atrial fibrillation presenting to the emergency department today as a CPR in progress.  The patient was intubated and ACLS protocol was followed.  Eventually after multiple rounds of PEA the patient did have return of spontaneous circulation.  He does have some enlargement of his right ventricle here.  Calls placed to ICU for admission after stabilization here.  They did request a CT angiogram in addition to a CT scan of his head for further evaluation for pulmonary embolism or ICH respecively.  They will come down and see the patient.  The patient's initial electrolytes are unremarkable.  CT scan showed pulmonary edema versus pneumonia.  The patient is covered with antibiotics.  He is admitted to the ICU for further evaluation management.  CRITICAL CARE Performed by: Carin Charleston   Total critical care time: 45 minutes  Critical care time was exclusive of separately billable  procedures and treating other patients.  Critical care was necessary to treat or prevent imminent or life-threatening deterioration.  Critical care was time spent personally by me on the following activities: development of treatment plan with patient and/or surrogate as well as nursing, discussions with consultants, evaluation of patient's response to treatment, examination of patient, obtaining history from patient or surrogate, ordering and performing treatments and interventions, ordering and review of laboratory studies, ordering and review of radiographic studies, pulse oximetry and re-evaluation of patient's condition.   Amount and/or Complexity of Data Reviewed Labs: ordered. Radiology: ordered.  Risk Prescription drug management. Decision regarding hospitalization.        Final diagnoses:  Cardiac arrest [facility])    ED Discharge Orders     None          Carin Charleston, MD 10/01/2023 1535

## 2023-10-08 NOTE — Progress Notes (Signed)
Pharmacy Antibiotic Note  Clarence Pierce is a 53 y.o. male for which pharmacy has been consulted for cefepime and vancomycin dosing for sepsis. Patient with a history of AF, HLD, HF.  SCr 3.18 - AKI WBC 23.2>12.6; LA >9; T 89.1; HR 98; RR 29  Plan: Cefepime 2g q12hr  Vancomycin 2000 mg once, subsequent dosing as indicated per random vancomycin level until renal function stable and/or improved, at which time scheduled dosing can be considered Monitor WBC, fever, renal function, cultures De-escalate when able  Height: 5' 9 (175.3 cm) IBW/kg (Calculated) : 70.7  No data recorded.  Recent Labs  Lab 09/09/2023 1357 09/14/2023 1408  WBC 23.2*  --   CREATININE 2.85* 2.90*    CrCl cannot be calculated (Unknown ideal weight.).    No Known Allergies  Microbiology results: Pending  Thank you for allowing pharmacy to be a part of this patient's care.  Dionicio Fray, PharmD, BCPS 09/28/2023 3:45 PM ED Clinical Pharmacist -  [phone]

## 2023-10-08 NOTE — Progress Notes (Signed)
 MB called spoke to Nurse. Pt is just getting to floor and still being received by Nurse Team. Requested EEG give them approximately 20-30 minutes longer.

## 2023-10-08 NOTE — Progress Notes (Signed)
 Pt transported on vent to 71M w/o any complications.

## 2023-10-08 NOTE — Progress Notes (Signed)
 Attempted Echocardiogram, Per MD hold off on stat until the patient is transferred upstairs.

## 2023-10-08 DEATH — deceased
